# Patient Record
Sex: Male | Born: 1940
Health system: Southern US, Community
[De-identification: ages and names within clinical notes are randomized; demographics above are authoritative.]

## PROBLEM LIST (undated history)

## (undated) DIAGNOSIS — T7840XA Allergy, unspecified, initial encounter: Secondary | ICD-10-CM

## (undated) DIAGNOSIS — I10 Essential (primary) hypertension: Secondary | ICD-10-CM

## (undated) DIAGNOSIS — N189 Chronic kidney disease, unspecified: Secondary | ICD-10-CM

## (undated) DIAGNOSIS — E785 Hyperlipidemia, unspecified: Secondary | ICD-10-CM

## (undated) DIAGNOSIS — L409 Psoriasis, unspecified: Secondary | ICD-10-CM

## (undated) DIAGNOSIS — I2510 Atherosclerotic heart disease of native coronary artery without angina pectoris: Secondary | ICD-10-CM

## (undated) DIAGNOSIS — I252 Old myocardial infarction: Secondary | ICD-10-CM

## (undated) DIAGNOSIS — M1990 Unspecified osteoarthritis, unspecified site: Secondary | ICD-10-CM

## (undated) DIAGNOSIS — C4490 Unspecified malignant neoplasm of skin, unspecified: Secondary | ICD-10-CM

## (undated) HISTORY — DX: Atherosclerotic heart disease of native coronary artery without angina pectoris: I25.10

## (undated) HISTORY — PX: OTHER SURGICAL HISTORY: SHX169

## (undated) HISTORY — DX: Allergy, unspecified, initial encounter: T78.40XA

## (undated) HISTORY — DX: Unspecified malignant neoplasm of skin, unspecified: C44.90

## (undated) HISTORY — DX: Psoriasis, unspecified: L40.9

## (undated) HISTORY — DX: Old myocardial infarction: I25.2

## (undated) HISTORY — DX: Hyperlipidemia, unspecified: E78.5

## (undated) HISTORY — DX: Chronic kidney disease, unspecified: N18.9

## (undated) HISTORY — PX: HEMORROIDECTOMY: SUR656

## (undated) HISTORY — DX: Unspecified osteoarthritis, unspecified site: M19.90

## (undated) HISTORY — DX: Essential (primary) hypertension: I10

---

## 2004-01-10 ENCOUNTER — Inpatient Hospital Stay (HOSPITAL_COMMUNITY): Admission: EM | Admit: 2004-01-10 | Discharge: 2004-01-14 | Payer: Self-pay | Admitting: Emergency Medicine

## 2004-01-13 HISTORY — PX: CARDIAC CATHETERIZATION: SHX172

## 2010-04-10 ENCOUNTER — Ambulatory Visit: Payer: Self-pay | Admitting: Cardiology

## 2010-04-10 ENCOUNTER — Encounter: Payer: Self-pay | Admitting: Family Medicine

## 2010-07-15 ENCOUNTER — Ambulatory Visit: Payer: Self-pay | Admitting: Cardiology

## 2010-07-23 NOTE — Letter (Signed)
Summary: [facility]   Imported By: Maryln Gottron 04/21/2010 09:42:42  _____________________________________________________________________  External Attachment:    Type:   Image     Comment:   External Document

## 2010-09-17 ENCOUNTER — Telehealth: Payer: Self-pay | Admitting: Cardiology

## 2010-09-17 NOTE — Telephone Encounter (Signed)
recvd call from pt wife, requesting crestor 10mg  samples.  Pt will pick up today.

## 2010-10-13 ENCOUNTER — Telehealth: Payer: Self-pay | Admitting: Cardiology

## 2010-10-13 NOTE — Telephone Encounter (Signed)
Wife called requesting samples for husband of Crestor 10 mg. Do have available. Will pick up.

## 2010-10-13 NOTE — Telephone Encounter (Signed)
Husband needs a refill of Crestor samples. Please call back if/when samples are ready for pickup. I have pulled the chart.

## 2010-10-14 ENCOUNTER — Other Ambulatory Visit: Payer: Self-pay | Admitting: Cardiology

## 2010-10-14 DIAGNOSIS — E785 Hyperlipidemia, unspecified: Secondary | ICD-10-CM

## 2010-10-14 NOTE — Telephone Encounter (Signed)
escribe medication per fax request  

## 2010-11-06 NOTE — Cardiovascular Report (Signed)
NAME:  [name] Brennan, [name] Brennan NO.:  [id number]   MEDICAL RECORD NO.:  [id number]                   PATIENT TYPE:  INP   LOCATION:  2006                                 FACILITY:  [facility]   PHYSICIAN:  Elmore Guise., M.D.           DATE OF BIRTH:  1941/02/05   DATE OF PROCEDURE:  01/13/2004  DATE OF DISCHARGE:                              CARDIAC CATHETERIZATION   PREOPERATIVE DIAGNOSES:  1. Inferior myocardial infarction.  2. Hypertension.  3. Dyslipidemia.   POSTOPERATIVE DIAGNOSES:  1. Inferior myocardial infarction.  2. Hypertension.  3. Dyslipidemia.   CARDIOLOGIST PRESENT:  Dr. Lady Deutscher   _____________  Dr. Delfin Edis   PROCEDURE:  1. Cardiac catheterization.  2. Coronary left ventricular and right iliac angiogram.   PROCEDURE IN DETAIL:  The patient brought to the cardiac catheterization  laboratory after obtaining informed consent.  The patient was prepped and  draped in a sterile fashion.  A 6-French sheath was placed in the right  femoral artery.  Approximately 10 mL of lidocaine was used for local  anesthesia.  A 6-French JL4, JR4, and pigtail catheters were used for  coronary and LV angiogram.  The patient tolerated procedure well.  No  apparent complications.   FINDINGS:  1. Left main:  Normal.  2. LAD:  Mild luminal irregularities (large caliber vessel).  3. Left circumflex:  Nondominant, mild luminal irregularities.  4. RCA:  Dominant.  100% mid occlusion.  Distal, mid vessel fills with good     left to right collaterals.  Faint right to right collaterals also noted.  5. LV:  EF was 50-55% with mild diaphragmatic hypokinesis.  6. Right common femoral was normal.  7. Successful AngioSeal was deployed.  The patient was noted to have a very     small hematoma at access site.   IMPRESSION:  1. Single vessel coronary artery disease with good collateral flow.  2. Preserved left ventricular systolic function with ejection  fraction of 50-     55% with mild diaphragmatic hypokinesis.   PLAN:  Will continue maximal medical therapy.                                               Elmore Guise., M.D.    TWK/MEDQ  D:  01/13/2004  T:  01/13/2004  Job:  [id number]

## 2010-11-06 NOTE — H&P (Signed)
NAME:  [name], Brennan NO.:  [id number]   MEDICAL RECORD NO.:  [id number]                   PATIENT TYPE:  EMS   LOCATION:  [facility]                                 FACILITY:  [facility]   PHYSICIAN:  Elmore Guise., M.D.           DATE OF BIRTH:  05/01/1941   DATE OF ADMISSION:  01/10/2004  DATE OF DISCHARGE:                                HISTORY & PHYSICAL   HISTORY OF PRESENT ILLNESS:  [age]-year-old white male with past  medical history of hypertension, dyslipidemia and remote history of tobacco  abuse, who presents with 2-week history of productive cough, shortness of  breath and sharp inspiratory chest pain.  The patient reports a couple of  visits to his primary care physician over the last 2 weeks because he just  doesn't feel right, reports that he has been lethargic and fatigued for the  last 10 days.  He was seen for a cardiac evaluation, reports no further  chest pain except an inspiratory pain and a raw feeling in his chest,  however, notes that his pains improve when he repositions himself or he gets  and walks around.  Today, the patient had blood work done in the office.  CPK was 113, MB was 7.1, troponin I was 3.9; the patient was then advised to  come to the ER for further evaluation.  EKG done at outside facility shows  normal sinus rhythm with inferior Q waves and approximately 1-mm ST  elevation.  EKG done today shows normal sinus rhythm with inferior Q waves  and less than 0.5-mm ST elevation.  Lateral precordial leads show  nonspecific changes.  Patient currently without any significant chest pain.  Does report mild inspiratory pain, however, raw feeling has resolved.   REVIEW OF SYSTEMS:  Review of systems positive for productive cough and  night sweats, shortness of breath and easy fatigability, otherwise,  negative.   CURRENT MEDICATIONS:  1. Trandate 300 mg p.o. b.i.d.  2. Benicar 20 mg daily.  3. Tricor 160 mg daily.  4. Hydrochlorothiazide 25 mg daily.  5. Aspirin 325 mg daily.  6. Azithromycin 500 mg daily.  7. Ibuprofen on a p.r.n. basis.   ALLERGIES:  None.   FAMILY HISTORY:  Family history is positive for father with an MI in 79s and  20s; mother, brothers and sisters are healthy.   SOCIAL HISTORY:  He is married.  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   PHYSICAL EXAMINATION:  VITAL SIGNS:  Today, temperature is 97.7, blood  pressure 128/60, pulse is 78.  GENERAL:  He is an elderly white male, alert and oriented x4, in no acute  distress.  NECK:  Neck is supple, no lymphadenopathy, 2+ carotids, no JVD, no bruits.  LUNGS:  Lungs are clear.  HEART:  Heart is regular.  Normal S1 and S2.  No significant murmurs,  gallops or rubs.  ABDOMEN:  Abdomen is soft, nontender and nondistended.  EXTREMITIES:  Extremities are warm with 2+ pulses and no edema.   SIGNIFICANT LABORATORY DATA:  Labs done in the office show a CPK of 133, MB  of 7.1, troponin was 3.9.  Labs done in the ER show a myoglobin of 137,  decreasing to 133 one hour later, MB of 5.6, going to 6.0, and troponin of  1.7 and then 2.0.   EKG does show sinus rhythm with inferior Q waves and 0.5-mm ST elevations  which are coming down.   Chest x-ray is pending.   IMPRESSION AND PLAN:  1. Recent myocardial infarction, likely occurred 24-48 hours ago.  Patient's     troponin now has stabilized and actually coming down.  His MBs are     decreasing and myoglobins are normal.  EKG is improving.  I have elected     to treat him with heparin, Plavix, beta blocker, aspirin, nitroglycerin     and Integrilin.  We will plan for catheterization Monday, unless he has     further recurrence of his pain or significant increase in his enzymes.  2. For his recent bout of bronchitis, the patient is on azithromycin 500 mg     daily for 3 days.  3. Dyslipidemia.  He will continue his Tricor.  4. We did discuss cardiac catheterization  at length and the patient and wife     are aware and agree with proceeding on Monday, unless clinical condition     changes.                                                Elmore Guise., M.D.    TWK/MEDQ  D:  01/10/2004  T:  01/11/2004  Job:  [id number]

## 2010-11-06 NOTE — Discharge Summary (Signed)
NAME:  [name] Brennan, [name] Brennan NO.:  [id number]   MEDICAL RECORD NO.:  [id number]                   PATIENT TYPE:  INP   LOCATION:  2006                                 FACILITY:  [facility]   PHYSICIAN:  Elmore Guise., M.D.           DATE OF BIRTH:  07/08/1940   DATE OF ADMISSION:  01/10/2004  DATE OF DISCHARGE:  01/14/2004                                 DISCHARGE SUMMARY   DISCHARGE DIAGNOSES:  1. Inferior myocardial infarction.  2. History of hypertension.  3. Dyslipidemia.  4. Bronchitis.   HISTORY OF PRESENT ILLNESS:  A 70 year old white male with past medical  history of hypertension and dyslipidemia presented to the office with chief  complaint of two- to three-week history of progressive inspiratory chest  pain.  EKG done by primary care physician showed probable inferior MI.  Lab  work in the office showed mildly elevated troponin with normal CPK, CPK-MB.  The patient was sent to the ER for admission and treatment.   HOSPITAL COURSE:  The patient was admitted to telemetry and treated with  heparin, Integrilin, aspirin, Lopressor.  Enzymes continued to decrease.  The patient underwent cardiac catheterization on January 13, 2004 showing a  proximal to mid occlusion of the RCA with good collateral left-to-right  filling, very faint collaterals from right-to-right.  Left main, LAD, LCX  showed no significant disease.  LVEF was 50%-55% with mild diaphragmatic  hypokinesis.  Following catheterization, the patient continued to do well  and had no further chest pain.   DISCHARGE MEDICATIONS:  He will be discharged on the following medications:  1. Avapro 150 mg daily.  2. Aspirin 325 mg daily.  3. Hydrochlorothiazide 12.5 mg daily.  4. Lopressor 25 mg b.i.d.  5. Niacin 500 mg p.o. q.h.s.  6. Plavix 75 mg a day.  7. TriCor 145 mg daily.  8. Advil on a p.r.n. basis.   DISCHARGE INSTRUCTIONS:  Instructed the patient to do no strenuous activity  or heavy  lifting for two weeks.   FOLLOW UP:  The patient will follow up at [facility] in 1-2 weeks  or sooner if needed.   PROCEDURES DURING HOSPITALIZATION:  Cardiac catheterization.                                                Elmore Guise., M.D.    TWK/MEDQ  D:  01/14/2004  T:  01/14/2004  Job:  [id number]

## 2010-11-10 ENCOUNTER — Telehealth: Payer: Self-pay | Admitting: Cardiology

## 2010-11-10 NOTE — Telephone Encounter (Signed)
Pt's wife called about wanting samples of  Crestor please call back

## 2010-11-10 NOTE — Telephone Encounter (Signed)
Wife called requesting samples of crestor. May pick up.

## 2010-12-08 ENCOUNTER — Other Ambulatory Visit: Payer: Self-pay | Admitting: Cardiology

## 2010-12-08 NOTE — Telephone Encounter (Signed)
Med refill

## 2011-01-07 ENCOUNTER — Telehealth: Payer: Self-pay | Admitting: Cardiology

## 2011-01-07 NOTE — Telephone Encounter (Signed)
Called requesting samples of Crestor 10 mg. Have available. Will pick up 

## 2011-01-07 NOTE — Telephone Encounter (Signed)
ASKING FOR SAMPLES OF CRESTOR.

## 2011-01-15 ENCOUNTER — Encounter: Payer: Self-pay | Admitting: Cardiology

## 2011-01-18 ENCOUNTER — Encounter (INDEPENDENT_AMBULATORY_CARE_PROVIDER_SITE_OTHER): Payer: Self-pay | Admitting: Surgery

## 2011-02-04 ENCOUNTER — Encounter (INDEPENDENT_AMBULATORY_CARE_PROVIDER_SITE_OTHER): Payer: Self-pay | Admitting: Surgery

## 2011-02-04 ENCOUNTER — Telehealth: Payer: Self-pay | Admitting: Cardiology

## 2011-02-04 ENCOUNTER — Ambulatory Visit (INDEPENDENT_AMBULATORY_CARE_PROVIDER_SITE_OTHER): Payer: Medicare Other | Admitting: Surgery

## 2011-02-04 VITALS — BP 138/92 | HR 60 | Temp 98.2°F | Ht 71.0 in | Wt 160.2 lb

## 2011-02-04 DIAGNOSIS — K4020 Bilateral inguinal hernia, without obstruction or gangrene, not specified as recurrent: Secondary | ICD-10-CM

## 2011-02-04 NOTE — Patient Instructions (Signed)
Given literature for inguinal hernias

## 2011-02-04 NOTE — Telephone Encounter (Signed)
Chart in box 

## 2011-02-04 NOTE — Progress Notes (Addendum)
Chief Complaint  Patient presents with  . Other    new pt- eval of bilateral hernias    [name] Brennan is a 70 y.o. (DOB: [birth year])  white male who is a patient of BURNETT,BRENT A, MD  and comes to me today for left/bilateral inguinal hernias.  The patient comes to me today accompanied by his wife and son. He has noticed a left inguinal hernia for 30 years. 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 He tolerated hernia for years, though it is slightly larger and pops out more often. He wears a truss that he has partially fabricated himself.  Gastrointestinal: No history of peptic ulcer disease, liver disease, pancreatic disease, or colon disease. He had a hemorrhoidectomy by Dr. Judithe Modest in 1978.  He is hard of hearing which makes the interview challenging.  Past Medical History  Diagnosis Date  . Allergy   . Atherosclerotic heart disease   . Arthritis   . Hyperlipidemia   . Hypertension     Benign essential  . Psoriasis   . Hearing loss   . Inguinal hernia July 2012    Bilateral   . Hemorrhoids 1979  . Heart attack     heart blockage  . Skin cancer     Past Surgical History  Procedure Date  . Hemorroidectomy     Current Outpatient Prescriptions  Medication Sig Dispense Refill  . aspirin 81 MG tablet Take 81 mg by mouth daily.        . fenofibrate 160 MG tablet TAKE 1 TABLET BY MOUTH EVERY DAY  30 tablet  5  . fish oil-omega-3 fatty acids 1000 MG capsule Take 1,000-2,000 mg by mouth daily.        Marland Kitchen gabapentin (NEURONTIN) 600 MG tablet Take 600 mg by mouth as needed.        . GuaiFENesin (MUCUS RELIEF ADULT PO) Take by mouth daily.        Marland Kitchen ibuprofen (ADVIL,MOTRIN) 400 MG tablet as needed.      Marland Kitchen lisinopril-hydrochlorothiazide (PRINZIDE,ZESTORETIC) 20-12.5 MG per tablet Daily.      . metoprolol (LOPRESSOR) 50 MG tablet TAKE 1 TABLET EVERY DAY  30 tablet  5  . niacin 500 MG CR capsule Take 500 mg by mouth at bedtime.        . rosuvastatin (CRESTOR) 10 MG tablet  Take 10 mg by mouth daily.        Marland Kitchen triamcinolone (KENALOG) 0.1 % cream Daily.      Marland Kitchen lisinopril (PRINIVIL,ZESTRIL) 10 MG tablet Take 10 mg by mouth daily.         Review of Systems: Skin:  Psoriasis.  No history of abnormal moles. Infection:  No history of hepatitis or HIV.  No history of MRSA. Neurologic:  No history of stroke.  No history of seizure.  No history of headaches. Cardiac:  Hypertension. He had a cardiac cath approx 2007 by Dr. Reyes Ivan.  He was told things were okay.  He is now followed by Dr. Demetrius Charity. [place].   Pulmonary:  Does not smoke cigarettes.  No asthma or bronchitis.  No OSA/CPAP.  Endocrine:  No diabetes. No thyroid disease. Gastrointestinal:  See HPI. Urologic:  No history of kidney stones.  No history of bladder infections. Musculoskeletal:  No history of joint or back disease. Hematologic:  No bleeding disorder.  No history of anemia.  Not anticoagulated.  No Known Allergies  SOCIAL and FAMILY HISTORY: Retired Psychologist, occupational.  Accompanied by wife and  son.  PHYSICAL EXAM: BP 138/92  Pulse 60  Temp 98.2 F (36.8 C)  Ht 5\' 11"  (1.803 m)  Wt 160 lb 4 oz (72.689 kg)  BMI 22.35 kg/m2  HEENT: Normal. Pupils equal. Normal dentition. Neck: Supple. No thyroid mass. Lymph Nodes:  No supraclavicular or cervical nodes. Lungs: Clear and symmetric. Heart:  RRR. No murmur.  Abdomen: Left > right inguinal hernia.  Reducible.  No mass. No tenderness.Normal bowel sounds.  No abdominal scars. Rectal: Not done. Extremities:  Good strength in upper and lower extremities. Neurologic:  Grossly intact to motor and sensory function.  DATA REVIEWED: Notes from Dr. Doristine Counter  ASSESSMENT and PLAN: 1.  Bilateral inguinal hernia.  Left > right.    I discussed the indications and complications of hernia surgery with the patient.  I discussed both the laparoscopic and open approach to hernia repair..  The potential risks of hernia surgery include, but are not limited to, bleeding,  infection, open surgery, nerve injury, and recurrence of the hernia.  I provided the patient literature about hernia surgery.  I think he is a candidate for laparoscopic repair.  2.  CAD.  Sees Dr. P. [place].    Would like card clearance prior to surgery.   [Cleared by Dr. [place].     DN 02/10/11] 3.  Hypertension. 4.  Psoriasis. [5.  Mildly elevated creatinine on pre op labs.  DN 03/05/11]

## 2011-02-04 NOTE — Telephone Encounter (Signed)
Pt's wife was sent from [facility], Dr. Ezzard Standing to schedule a cardiac clearance very soon, pt surgery is for double hernia but not scheduled yet, please call pt's wife to discuss and schedule with Dr. [place] very soon

## 2011-02-04 NOTE — Telephone Encounter (Signed)
Lm

## 2011-02-08 NOTE — Telephone Encounter (Signed)
Notified wife that Mr.Raben is cleared for surgery. Sent fax to Dr. Ezzard Standing that he has been cleared. Also made him an app to see Dr. [place] for Oct,

## 2011-02-09 ENCOUNTER — Telehealth (INDEPENDENT_AMBULATORY_CARE_PROVIDER_SITE_OTHER): Payer: Self-pay

## 2011-02-09 NOTE — Telephone Encounter (Signed)
I notified pt's wife we received the cardiac clearance from Dr [place].  I asked him to stop aspirin and ibuprofen 7 days preop.  The orders will be given to surgery scheduling tomorrow to call pt

## 2011-02-11 ENCOUNTER — Encounter: Payer: Self-pay | Admitting: Cardiology

## 2011-02-25 ENCOUNTER — Telehealth: Payer: Self-pay | Admitting: Cardiology

## 2011-02-25 NOTE — Telephone Encounter (Signed)
[facility] faxed request for last OV Note, most recent EKG, Chest Xray, Stress test and ECHO if at all available.   Faxed last OV Note and EKG today.  No Chest Xray, Stress or ECHO available.

## 2011-03-04 ENCOUNTER — Ambulatory Visit (HOSPITAL_COMMUNITY)
Admission: RE | Admit: 2011-03-04 | Discharge: 2011-03-04 | Disposition: A | Discharge status: Home / Self Care | Payer: Medicare Other | Source: Ambulatory Visit | Attending: Surgery | Admitting: Surgery

## 2011-03-04 ENCOUNTER — Other Ambulatory Visit (INDEPENDENT_AMBULATORY_CARE_PROVIDER_SITE_OTHER): Payer: Self-pay | Admitting: Surgery

## 2011-03-04 ENCOUNTER — Encounter (HOSPITAL_COMMUNITY): Payer: Medicare Other

## 2011-03-04 DIAGNOSIS — K4020 Bilateral inguinal hernia, without obstruction or gangrene, not specified as recurrent: Secondary | ICD-10-CM | POA: Insufficient documentation

## 2011-03-04 DIAGNOSIS — Z01818 Encounter for other preprocedural examination: Secondary | ICD-10-CM | POA: Insufficient documentation

## 2011-03-04 DIAGNOSIS — Z01812 Encounter for preprocedural laboratory examination: Secondary | ICD-10-CM | POA: Insufficient documentation

## 2011-03-04 LAB — BASIC METABOLIC PANEL
BUN: 31 mg/dL — ABNORMAL HIGH (ref 6–23)
Calcium: 10.4 mg/dL (ref 8.4–10.5)
Creatinine, Ser: 1.58 mg/dL — ABNORMAL HIGH (ref 0.50–1.35)
GFR calc Af Amer: 53 mL/min — ABNORMAL LOW (ref >60)
GFR calc non Af Amer: 43 mL/min — ABNORMAL LOW (ref >60)
Glucose, Bld: 94 mg/dL (ref 70–99)
Potassium: 4.4 mEq/L (ref 3.5–5.1)

## 2011-03-04 LAB — CBC
HCT: 43 % (ref 39.0–52.0)
MCH: 28.7 pg (ref 26.0–34.0)
MCHC: 32.8 g/dL (ref 30.0–36.0)
Platelets: 203 10*3/uL (ref 150–400)

## 2011-03-05 NOTE — Progress Notes (Signed)
Quick Note:  These labs are OK for surgery. ______ 

## 2011-03-11 ENCOUNTER — Ambulatory Visit (HOSPITAL_COMMUNITY)
Admission: RE | Admit: 2011-03-11 | Discharge: 2011-03-11 | Disposition: A | Discharge status: Home / Self Care | Payer: Medicare Other | Source: Ambulatory Visit | Attending: Surgery | Admitting: Surgery

## 2011-03-11 DIAGNOSIS — I2510 Atherosclerotic heart disease of native coronary artery without angina pectoris: Secondary | ICD-10-CM | POA: Insufficient documentation

## 2011-03-11 DIAGNOSIS — I252 Old myocardial infarction: Secondary | ICD-10-CM | POA: Insufficient documentation

## 2011-03-11 DIAGNOSIS — Z01812 Encounter for preprocedural laboratory examination: Secondary | ICD-10-CM | POA: Insufficient documentation

## 2011-03-11 DIAGNOSIS — I10 Essential (primary) hypertension: Secondary | ICD-10-CM | POA: Insufficient documentation

## 2011-03-11 DIAGNOSIS — Z85828 Personal history of other malignant neoplasm of skin: Secondary | ICD-10-CM | POA: Insufficient documentation

## 2011-03-11 DIAGNOSIS — K4020 Bilateral inguinal hernia, without obstruction or gangrene, not specified as recurrent: Secondary | ICD-10-CM

## 2011-03-11 DIAGNOSIS — Z01818 Encounter for other preprocedural examination: Secondary | ICD-10-CM | POA: Insufficient documentation

## 2011-03-11 DIAGNOSIS — E785 Hyperlipidemia, unspecified: Secondary | ICD-10-CM | POA: Insufficient documentation

## 2011-03-11 DIAGNOSIS — R748 Abnormal levels of other serum enzymes: Secondary | ICD-10-CM | POA: Insufficient documentation

## 2011-03-11 DIAGNOSIS — L408 Other psoriasis: Secondary | ICD-10-CM | POA: Insufficient documentation

## 2011-03-11 DIAGNOSIS — Z79899 Other long term (current) drug therapy: Secondary | ICD-10-CM | POA: Insufficient documentation

## 2011-03-11 HISTORY — PX: INGUINAL HERNIA REPAIR: SUR1180

## 2011-03-13 ENCOUNTER — Encounter: Payer: Self-pay | Admitting: Cardiology

## 2011-03-16 NOTE — Op Note (Signed)
[name], [name] NO.:  [id number]  MEDICAL RECORD NO.:  [id number]  LOCATION:  [facility]                         FACILITY:  [facility]  PHYSICIAN:  Sandria Bales. Ezzard Standing, M.D.  DATE OF BIRTH:  June 23, 1940  DATE OF PROCEDURE: 20 Sept 2012                              OPERATIVE REPORT   PREOPERATIVE DIAGNOSIS:  Bilateral inguinal hernia, left greater than right.  POSTOPERATIVE DIAGNOSIS:  Bilateral hernia, left greater than right (left inguinal hernia was both indirect and direct, right inguinal hernia was direct inguinal hernia).  PROCEDURE:  Laparoscopic bilateral inguinal hernia repair with pre cut Atrium mesh.  SURGEON:  Sandria Bales. Ezzard Standing, M.D.  FIRST ASSISTANT:  None.  ANESTHESIA:  General endotracheal.  ESTIMATED BLOOD LOSS:  Minimal.  INDICATION FOR PROCEDURE:  [name] Brennan is a 70 year old white male patient of Dr. Marjory Lies, who comes with a longstanding left inguinal hernia and has a smaller right inguinal hernia.  He has been wearing a truss that he made himself, which took care of his symptoms until recently. He now comes for repair of these hernias.  I discussed with him laparoscopic inguinal hernia repair.  I discussed the indications and potential complications.  The potential complications include bleeding, infection, nerve injury, and the possibility of open hernia.  OPERATIVE NOTE:  The patient was placed in a supine position, given a general endotracheal anesthetic.  His lower abdomen was shaved, prepped with ChloraPrep and Betadine and sterilely draped.  A time-out was held and surgical checklist run.  He had a Foley catheter in place, was given 1 g of Ancef at the initiation of the procedure.  I went through an infraumbilical incision with sharp dissection carried down to the rectus abdominis fascia.    I went to the left because this was the larger hernia, cut through the anterior rectus fascia, retracted the rectus muscle anteriorly,  and passed the PBD-2 balloon in the preperitoneal space and insufflated this under direct visualization.  He had a moderate dissection in the preperitoneal space.  The balloon took the left inferior epigastric vessels off the left lower abdominal wall, so I did have to deal with that during the procedure.  I placed two additional trocars, a 5-mm right lower quadrant and a 5-mm left lower quadrant under direct visualization.    I dissected out to both inguinal floors, the cord structures, and laterally in the pre peritoneal space.  On the left side, he had a medium-sized direct inguinal hernia at the lateral inguinal floor.  He also had a small to medium indirect hernia sac, which I took off the cord structures. The sac was probably 7-8 cm long.  I placed an Endoloop across the base of the sac.  On the right side, he had a medium sized direct inguinal hernia.  I found no evidence of an indirect component.  I then carried out a full repair using precut Atrium C-QUR Lite mesh on both sides, I placed this in the preperitoneal space.  I tacked it medially to the pubic tubercle, inferiorly to [employer] ligament.  I put the mesh around the cord structures and tacked it anteriorly to the transversalis  fascia, avoided the area lateral to the cord structures and inferior to the ileopubic tract.  On the left side, because it dissected down the inferior epigastric vessels, I put this mesh anterior to the inferior vessels on the left side.  Both mesh lay flat.  They covered the direct floor of hernias well, they recreated the internal ring well.  I desufflated the abdomen once to make sure the mesh laid in place, which it did.  During the dissection, he did get a fair amount of subcu air.  I then removed the trocars.  I closed the infraumbilical incision with a 0 Vicryl suture at the umbilicus and closed the skin with 5-0 Monocryl sutures.  I painted the wounds with Dermabond, sterilely dressed  them.  The patient tolerated the procedure well, was transported to recovery room in good condition.  Sponge and needle counts were correct at the end of the case.   Sandria Bales. Ezzard Standing, M.D., FACS   DHN/MEDQ  D:  03/11/2011  T:  03/11/2011  Job:  [id number]  cc:   Peter M. [place], M.D. Fax: [phone]  Marjory Lies, M.D. Fax: [phone]  Electronically Signed by Ovidio Kin M.D. on 03/16/2011 12:31:08 PM

## 2011-03-23 ENCOUNTER — Encounter (INDEPENDENT_AMBULATORY_CARE_PROVIDER_SITE_OTHER): Payer: Self-pay

## 2011-03-24 ENCOUNTER — Encounter (INDEPENDENT_AMBULATORY_CARE_PROVIDER_SITE_OTHER): Payer: Self-pay | Admitting: Surgery

## 2011-03-24 ENCOUNTER — Ambulatory Visit (INDEPENDENT_AMBULATORY_CARE_PROVIDER_SITE_OTHER): Payer: Medicare Other | Admitting: Surgery

## 2011-03-24 VITALS — BP 148/98 | HR 56 | Temp 98.6°F | Resp 12 | Ht 71.0 in | Wt 156.6 lb

## 2011-03-24 DIAGNOSIS — Z9889 Other specified postprocedural states: Secondary | ICD-10-CM | POA: Insufficient documentation

## 2011-03-24 DIAGNOSIS — Z8719 Personal history of other diseases of the digestive system: Secondary | ICD-10-CM

## 2011-03-24 NOTE — Progress Notes (Signed)
ASSESSMENT AND PLAN: 1.  Bilateral inguinal hernia repair.  Laparoscopic.  03/11/2011 at [facility].  Has done well.  Is to limit lifting for one month from surgery.  Return PRN.  2. CAD. Sees Dr. P. [place].  Has appointment to see him next week. 3. Hypertension.  4. Psoriasis.  5. Mildly elevated creatinine on pre op labs.   HISTORY OF PRESENT ILLNESS: Chief Complaint  Patient presents with  . Follow-up    PO BIH 03/11/11    [name] Brennan is a 70 y.o. (DOB: February 05, 1941)  white male who is a patient of BURNETT,BRENT A, MD and comes to me today for bilateral inguinal hernia.  He is accompanied with his wife.  He has some swelling and discoloration early after surgery.  This has resolved.  He is anxious to get more active.  I reminded him that I want him to take it easy for one month from the date of surgery.   PHYSICAL EXAM: BP 148/98  Pulse 56  Temp(Src) 98.6 F (37 C) (Temporal)  Resp 12  Ht 5\' 11"  (1.803 m)  Wt 156 lb 9.6 oz (71.033 kg)  BMI 21.84 kg/m2  Abdomen/groin:  Incisions are well healed.  He still has some dermabond on the skin.  He has a small knot in the left groin, which should go away with time.

## 2011-03-25 ENCOUNTER — Telehealth: Payer: Self-pay | Admitting: Cardiology

## 2011-03-25 NOTE — Telephone Encounter (Signed)
Wife called stating BP yest at doctor's office was 148/98. Was concerned. BP normally is 134-135/91/84. Has been eating a lot of peanuts. Per Dr. [place] advised to monitor BP and if continues to be elevated to call us back. He has an app 10/15

## 2011-03-25 NOTE — Telephone Encounter (Signed)
Pt called and said BP 148/97 please call

## 2011-04-02 ENCOUNTER — Ambulatory Visit: Payer: Self-pay | Admitting: Cardiology

## 2011-04-05 ENCOUNTER — Ambulatory Visit (INDEPENDENT_AMBULATORY_CARE_PROVIDER_SITE_OTHER): Payer: Medicare Other | Admitting: Cardiology

## 2011-04-05 ENCOUNTER — Encounter: Payer: Self-pay | Admitting: Cardiology

## 2011-04-05 VITALS — BP 152/103 | HR 86 | Ht 71.0 in | Wt 161.0 lb

## 2011-04-05 DIAGNOSIS — I259 Chronic ischemic heart disease, unspecified: Secondary | ICD-10-CM

## 2011-04-05 DIAGNOSIS — I10 Essential (primary) hypertension: Secondary | ICD-10-CM | POA: Insufficient documentation

## 2011-04-05 DIAGNOSIS — I2510 Atherosclerotic heart disease of native coronary artery without angina pectoris: Secondary | ICD-10-CM

## 2011-04-05 DIAGNOSIS — E785 Hyperlipidemia, unspecified: Secondary | ICD-10-CM

## 2011-04-05 MED ORDER — AMLODIPINE BESYLATE 5 MG PO TABS: 5.0000 mg | ORAL_TABLET | Freq: Every day | ORAL | Status: DC

## 2011-04-05 NOTE — Assessment & Plan Note (Signed)
Blood pressure is poorly controlled. Have recommended the addition of amlodipine 5 mg daily to his current regimen. We will have him return in 3 weeks for reassessment of his control.

## 2011-04-05 NOTE — Progress Notes (Signed)
[name] Brennan Date of Birth: Oct 02, 1940 Medical Record #[id number]  History of Present Illness: [name] Brennan is seen for one-year followup. He has a history of coronary disease with remote inferior myocardial infarction. He has known occlusion of the mid right coronary. He states he has been doing very well from a cardiac standpoint the past year. He denies any symptoms of chest pain or shortness of breath. He has had a difficult time with his blood pressure control. He is currently taking metoprolol and lisinopril. He was tried on lisinopril HCTZ and carvedilol but did not tolerate these. He also has a history of renal insufficiency with HCTZ. He does complain of dizziness when his blood pressure is elevated.  Current Outpatient Prescriptions on File Prior to Visit  Medication Sig Dispense Refill  . aspirin 81 MG tablet Take 81 mg by mouth daily.        . fenofibrate 160 MG tablet TAKE 1 TABLET BY MOUTH EVERY DAY  30 tablet  5  . fish oil-omega-3 fatty acids 1000 MG capsule Take 1,000-2,000 mg by mouth daily.        Marland Kitchen gabapentin (NEURONTIN) 600 MG tablet Take 600 mg by mouth as needed.        . GuaiFENesin (MUCUS RELIEF ADULT PO) Take by mouth daily.        Marland Kitchen ibuprofen (ADVIL,MOTRIN) 400 MG tablet as needed.      Marland Kitchen lisinopril (PRINIVIL,ZESTRIL) 10 MG tablet Take 10 mg by mouth daily.        . metoprolol (LOPRESSOR) 50 MG tablet TAKE 1 TABLET EVERY DAY  30 tablet  5  . niacin 500 MG CR capsule Take 500 mg by mouth at bedtime.        . rosuvastatin (CRESTOR) 10 MG tablet Take 10 mg by mouth daily.        Marland Kitchen triamcinolone (KENALOG) 0.1 % cream Daily.        Allergies  Allergen Reactions  . Avalide (Irbesartan-Hydrochlorothiazide)     Apparently Caused an increase in Creatinine  . Cymbalta (Duloxetine Hcl)     Anaphylactic    . Lexapro     Hives  . Tricor     Intolerant     Past Medical History  Diagnosis Date  . Allergy   . Atherosclerotic heart disease   . Hypertension     Benign  essential  . Psoriasis   . Hearing loss   . Inguinal hernia September 2012    Bilateral   . Hemorrhoids 1979  . Skin cancer   . Coronary disease     with remote inferior  myocardial Infarction. Documented occulusion of the right  coronary  . Dyslipidemia     Combined with Hx of marked hypertriglyceridemia  . Osteoarthritis   . Myocardial infarction, old     Past Surgical History  Procedure Date  . Hemorroidectomy   . Cardiac catheterization     Hx of Obstructive single-vessel CAD by Cardiac Cath on January 13, 2004   . Inguinal hernia repair 03/11/2011    bilateral    History  Smoking status  . Never Smoker   Smokeless tobacco  . Not on file    History  Alcohol Use No    Family History  Problem Relation Age of Onset  . Cancer Brother   . Cancer Brother     prostate and skin    Review of Systems: The review of systems is positive for  Bilateral inguinal hernia repair this  summer.  He is still recovering from this. He has mild soreness. All other systems were reviewed and are negative.  Physical Exam: BP 152/103  Pulse 86  Ht 5\' 11"  (1.803 m)  Wt 161 lb (73.029 kg)  BMI 22.45 kg/m2 The patient is alert and oriented x 3.  The mood and affect are normal.  The skin is warm and dry.  Color is normal.  The HEENT exam reveals that the sclera are nonicteric.  The mucous membranes are moist.  The carotids are 2+ without bruits.  There is no thyromegaly.  There is no JVD.  The lungs are clear.  The chest wall is non tender.  The heart exam reveals a regular rate with a normal S1 and S2.  There are no murmurs, gallops, or rubs.  The PMI is not displaced.   Abdominal exam reveals good bowel sounds.  There is no guarding or rebound.  There is no hepatosplenomegaly or tenderness.  There are no masses.  Exam of the legs reveal no clubbing, cyanosis, or edema.  The legs are without rashes.  The distal pulses are intact.  Cranial nerves II - XII are intact.  Motor and sensory functions  are intact.  The gait is normal.  LABORATORY DATA: ECG today demonstrates normal sinus rhythm with occasional PVC. There is evidence of an old inferior infarction.  Assessment / Plan:

## 2011-04-05 NOTE — Assessment & Plan Note (Signed)
He has known occlusion of the right coronary. He is asymptomatic. We will continue with his risk factor modification. I requested a copy of his most recent lab work from Dr. Doristine Counter.

## 2011-04-05 NOTE — Patient Instructions (Addendum)
Continue your current medications.  We will add amlodipine (Norvasc) 5 mg to your current therapy for your blood pressure.  Keep a diary of your blood pressure readings and bring to your next visit.  You will see Lawson Fiscal, my nurse practitioner, in 3 weeks.  11/5 at 9:00

## 2011-04-09 ENCOUNTER — Telehealth: Payer: Self-pay | Admitting: Cardiology

## 2011-04-09 NOTE — Telephone Encounter (Signed)
Pt's wife requesting samples of crestor

## 2011-04-09 NOTE — Telephone Encounter (Signed)
Crestor 10 mg/ 28 sample pills placed at the front desk. Patient aware.

## 2011-04-26 ENCOUNTER — Encounter: Payer: Self-pay | Admitting: Nurse Practitioner

## 2011-04-26 ENCOUNTER — Telehealth: Payer: Self-pay | Admitting: Cardiology

## 2011-04-26 ENCOUNTER — Ambulatory Visit (INDEPENDENT_AMBULATORY_CARE_PROVIDER_SITE_OTHER): Payer: Medicare Other | Admitting: Nurse Practitioner

## 2011-04-26 VITALS — BP 160/100 | HR 70 | Ht 71.0 in | Wt 161.8 lb

## 2011-04-26 DIAGNOSIS — I259 Chronic ischemic heart disease, unspecified: Secondary | ICD-10-CM

## 2011-04-26 DIAGNOSIS — I2510 Atherosclerotic heart disease of native coronary artery without angina pectoris: Secondary | ICD-10-CM

## 2011-04-26 DIAGNOSIS — I10 Essential (primary) hypertension: Secondary | ICD-10-CM

## 2011-04-26 MED ORDER — AMLODIPINE BESYLATE 5 MG PO TABS: 5.0000 mg | ORAL_TABLET | Freq: Two times a day (BID) | ORAL | Status: DC

## 2011-04-26 NOTE — Assessment & Plan Note (Signed)
Blood pressure is not at goal. He is willing to increase the Norvasc to BID. Will watch for swelling. Minimize NSAID use. I will see him back in 3 weeks. He is to bring his cuff in for [place] to check for accuracy. Patient is agreeable to this plan and will call if any problems develop in the interim.

## 2011-04-26 NOTE — Patient Instructions (Addendum)
Goal blood pressure is less than 135/85  Keep checking your blood pressure at home for me and write the readings down.   Increase the Amlodipine to 5 mg two times a day. Watch for swelling.  I need to see him back in 3 weeks. Bring your cuff for me to check.

## 2011-04-26 NOTE — Progress Notes (Signed)
[name] Brennan Date of Birth: Sep 24, 1940 Medical Record #[id number]  History of Present Illness: [name] Brennan is seen today for a 3 week check. He is seen for Dr. [place]. He has had Norvasc added to his regimen for better blood pressure control. He feels ok. No chest pain. He brings in his list of readings. Most of them remain elevated. There is concern about the accuracy of his cuff.   Current Outpatient Prescriptions on File Prior to Visit  Medication Sig Dispense Refill  . aspirin 81 MG tablet Take 81 mg by mouth daily.        . fenofibrate 160 MG tablet TAKE 1 TABLET BY MOUTH EVERY DAY  30 tablet  5  . fish oil-omega-3 fatty acids 1000 MG capsule Take 1,000-2,000 mg by mouth daily.        Marland Kitchen gabapentin (NEURONTIN) 600 MG tablet Take 600 mg by mouth as needed.        . GuaiFENesin (MUCUS RELIEF ADULT PO) Take by mouth daily.        Marland Kitchen ibuprofen (ADVIL,MOTRIN) 400 MG tablet as needed.      Marland Kitchen lisinopril (PRINIVIL,ZESTRIL) 10 MG tablet Take 10 mg by mouth daily.        . metoprolol (LOPRESSOR) 50 MG tablet TAKE 1 TABLET EVERY DAY  30 tablet  5  . niacin 500 MG CR capsule Take 500 mg by mouth at bedtime.        . rosuvastatin (CRESTOR) 10 MG tablet Take 10 mg by mouth daily.        Marland Kitchen triamcinolone (KENALOG) 0.1 % cream Daily.        Allergies  Allergen Reactions  . Avalide (Irbesartan-Hydrochlorothiazide)     Apparently Caused an increase in Creatinine  . Cymbalta (Duloxetine Hcl)     Anaphylactic    . Lexapro     Hives  . Tricor     Intolerant     Past Medical History  Diagnosis Date  . Allergy   . Hypertension     Benign essential  . Psoriasis   . Hearing loss   . Inguinal hernia September 2012    Bilateral   . Hemorrhoids 1979  . Skin cancer   . Coronary disease     with remote inferior  myocardial Infarction. Documented occulusion of the right  coronary  . Dyslipidemia     Combined with Hx of marked hypertriglyceridemia  . Osteoarthritis   . Myocardial infarction, old     Remote inferior MI with known occlusion of the RCA.     Past Surgical History  Procedure Date  . Hemorroidectomy   . Cardiac catheterization     Hx of Obstructive single-vessel CAD by Cardiac Cath on January 13, 2004   . Inguinal hernia repair 03/11/2011    bilateral    History  Smoking status  . Never Smoker   Smokeless tobacco  . Not on file    History  Alcohol Use No    Family History  Problem Relation Age of Onset  . Cancer Brother   . Cancer Brother     prostate and skin    Review of Systems: The review of systems is per the HPI. He is hard of hearing.  All other systems were reviewed and are negative.  Physical Exam: BP 160/100  Pulse 70  Ht 5\' 11"  (1.803 m)  Wt 161 lb 12.8 oz (73.392 kg)  BMI 22.57 kg/m2 Patient is very pleasant and in no acute  distress. Skin is warm and dry. Color is normal.  HEENT is unremarkable. Normocephalic/atraumatic. PERRL. Sclera are nonicteric. Neck is supple. No masses. No JVD. Lungs are clear. Cardiac exam shows a regular rate and rhythm. Abdomen is soft. Extremities are without edema. Gait and ROM are intact. No gross neurologic deficits noted.   LABORATORY DATA:   Assessment / Plan:

## 2011-04-26 NOTE — Telephone Encounter (Signed)
Pharm calling about amilodipine 5mg  Rx written wrong only for 15day supply and they need clarification

## 2011-04-26 NOTE — Assessment & Plan Note (Signed)
Remains asymptomatic.

## 2011-05-18 ENCOUNTER — Encounter: Payer: Self-pay | Admitting: Nurse Practitioner

## 2011-05-18 ENCOUNTER — Ambulatory Visit (INDEPENDENT_AMBULATORY_CARE_PROVIDER_SITE_OTHER): Payer: Medicare Other | Admitting: Nurse Practitioner

## 2011-05-18 VITALS — BP 120/80 | HR 62 | Ht 71.0 in | Wt 161.0 lb

## 2011-05-18 DIAGNOSIS — I2510 Atherosclerotic heart disease of native coronary artery without angina pectoris: Secondary | ICD-10-CM

## 2011-05-18 DIAGNOSIS — I10 Essential (primary) hypertension: Secondary | ICD-10-CM

## 2011-05-18 LAB — BASIC METABOLIC PANEL
BUN: 35 mg/dL — ABNORMAL HIGH (ref 6–23)
CO2: 21 mEq/L (ref 19–32)
Calcium: 9.6 mg/dL (ref 8.4–10.5)
Chloride: 107 mEq/L (ref 96–112)
Creatinine, Ser: 1.5 mg/dL (ref 0.4–1.5)
GFR: 48.58 mL/min — ABNORMAL LOW (ref >60.00)
Glucose, Bld: 80 mg/dL (ref 70–99)
Potassium: 4.4 mEq/L (ref 3.5–5.1)
Sodium: 139 mEq/L (ref 135–145)

## 2011-05-18 MED ORDER — LISINOPRIL 10 MG PO TABS: 10.0000 mg | ORAL_TABLET | Freq: Two times a day (BID) | ORAL | Status: DC

## 2011-05-18 NOTE — Progress Notes (Signed)
[name] Brennan Date of Birth: 11-07-1940 Medical Record #[id number]  History of Present Illness: [name] Brennan is seen back today for a follow up visit. He is seen for Dr. [place]. We increased his Norvasc to BID at his last visit for better blood pressure control. He is feeling "great". Has no complaint whatsoever. Trying to use less salt. Not dizzy or lightheaded. Tolerating his medicines well. He brings in his blood pressure readings from home. Early morning readings remain elevated. No chest pain or shortness of breath.   Current Outpatient Prescriptions on File Prior to Visit  Medication Sig Dispense Refill  . amLODipine (NORVASC) 5 MG tablet Take 1 tablet (5 mg total) by mouth 2 (two) times daily.  60 tablet  11  . aspirin 81 MG tablet Take 81 mg by mouth daily.        . fenofibrate 160 MG tablet TAKE 1 TABLET BY MOUTH EVERY DAY  30 tablet  5  . fish oil-omega-3 fatty acids 1000 MG capsule Take 1,000-2,000 mg by mouth daily.        Marland Kitchen gabapentin (NEURONTIN) 600 MG tablet Take 600 mg by mouth as needed.        . GuaiFENesin (MUCUS RELIEF ADULT PO) Take by mouth as needed.       Marland Kitchen ibuprofen (ADVIL,MOTRIN) 400 MG tablet as needed.      . metoprolol (LOPRESSOR) 50 MG tablet TAKE 1 TABLET EVERY DAY  30 tablet  5  . niacin 500 MG CR capsule Take 500 mg by mouth at bedtime.        . rosuvastatin (CRESTOR) 10 MG tablet Take 10 mg by mouth daily.        Marland Kitchen triamcinolone (KENALOG) 0.1 % cream Daily.      Marland Kitchen DISCONTD: lisinopril (PRINIVIL,ZESTRIL) 10 MG tablet Take 10 mg by mouth daily.          Allergies  Allergen Reactions  . Avalide (Irbesartan-Hydrochlorothiazide)     Apparently Caused an increase in Creatinine  . Cymbalta (Duloxetine Hcl)     Anaphylactic    . Lexapro     Hives  . Tricor     Intolerant     Past Medical History  Diagnosis Date  . Allergy   . Hypertension   . Psoriasis   . Hearing loss   . Inguinal hernia September 2012    Bilateral   . Hemorrhoids 1979  . Skin cancer     . Coronary disease     with remote inferior myocardial infarction. Documented occlusion of the right  coronary  . Dyslipidemia     Combined with hx of marked hypertriglyceridemia  . Osteoarthritis   . Myocardial infarction, old     Remote inferior MI with known occlusion of the RCA.   Marland Kitchen CRI (chronic renal insufficiency)     Past Surgical History  Procedure Date  . Hemorroidectomy   . Cardiac catheterization     Hx of obstructive single-vessel CAD by Cardiac Cath on January 13, 2004   . Inguinal hernia repair 03/11/2011    bilateral    History  Smoking status  . Never Smoker   Smokeless tobacco  . Not on file    History  Alcohol Use No    Family History  Problem Relation Age of Onset  . Cancer Brother   . Cancer Brother     prostate and skin    Review of Systems: The review of systems is per the HPI.  All other  systems were reviewed and are negative.  Physical Exam: BP 120/80  Pulse 62  Ht 5\' 11"  (1.803 m)  Wt 161 lb (73.029 kg)  BMI 22.45 kg/m2 Blood pressure by me is 160/90. His machine reads 159/94. Patient is very pleasant and in no acute distress. Skin is warm and dry. Color is normal.  HEENT is unremarkable. Normocephalic/atraumatic. PERRL. Sclera are nonicteric. Neck is supple. No masses. No JVD. Lungs are clear. Cardiac exam shows a regular rate and rhythm. Abdomen is soft. Extremities are without edema. Gait and ROM are intact. No gross neurologic deficits noted.  LABORATORY DATA: BMET is pending   Assessment / Plan:

## 2011-05-18 NOTE — Assessment & Plan Note (Signed)
Blood pressure has come down some but still not at goal. Morning readings remain elevated. His cuff that he uses is acceptable. We will check a BMET today. Lisinopril is increased to 10 mg BID. I will see him in a month and recheck a BMET on return. He is to continue to monitor at home. Patient is agreeable to this plan and will call if any problems develop in the interim.

## 2011-05-18 NOTE — Patient Instructions (Signed)
Let's increase the Lisinopril to two times a day.  Stay on your other medicines.  We will check your potassium and kidney function today.  I will see you in a month with repeat labs.  Call the [facility] office at [phone] if you have any questions, problems or concerns.

## 2011-05-18 NOTE — Assessment & Plan Note (Signed)
Remains asymptomatic.

## 2011-05-20 ENCOUNTER — Telehealth: Payer: Self-pay | Admitting: Cardiology

## 2011-05-20 DIAGNOSIS — I10 Essential (primary) hypertension: Secondary | ICD-10-CM

## 2011-05-20 NOTE — Telephone Encounter (Signed)
Pt spouse rtn call get lab results

## 2011-05-20 NOTE — Telephone Encounter (Signed)
Notified wife of lab results. Will recheck BMET on return visit 12/31

## 2011-05-24 ENCOUNTER — Telehealth: Payer: Self-pay | Admitting: *Deleted

## 2011-05-24 DIAGNOSIS — I10 Essential (primary) hypertension: Secondary | ICD-10-CM

## 2011-05-24 NOTE — Telephone Encounter (Signed)
Notified son of lab results. Will repeat when comes to see [facility] 12/7. Will send copy to Dr. Doristine Counter.

## 2011-05-24 NOTE — Telephone Encounter (Signed)
Message copied by Lorayne Bender on Mon May 24, 2011  6:13 PM ------      Message from: Dossie Arbour      Created: Fri May 21, 2011  8:41 AM                   ----- Message -----         From: Rosalio Macadamia, NP         Sent: 05/19/2011   7:50 AM           To: [facility] Triage            Ok to report. Labs are satisfactory. Will recheck a BMET on his return visit. Patient of Dr. Elvis Coil.

## 2011-05-28 ENCOUNTER — Other Ambulatory Visit: Payer: Medicare Other | Admitting: *Deleted

## 2011-06-11 ENCOUNTER — Other Ambulatory Visit: Payer: Self-pay | Admitting: *Deleted

## 2011-06-11 MED ORDER — METOPROLOL TARTRATE 50 MG PO TABS: 50.0000 mg | ORAL_TABLET | Freq: Every day | ORAL | Status: DC

## 2011-06-21 ENCOUNTER — Ambulatory Visit (INDEPENDENT_AMBULATORY_CARE_PROVIDER_SITE_OTHER): Payer: Medicare Other | Admitting: Nurse Practitioner

## 2011-06-21 ENCOUNTER — Encounter: Payer: Self-pay | Admitting: Nurse Practitioner

## 2011-06-21 ENCOUNTER — Other Ambulatory Visit: Payer: Medicare Other | Admitting: *Deleted

## 2011-06-21 VITALS — BP 130/92 | HR 60 | Ht 71.0 in | Wt 151.0 lb

## 2011-06-21 DIAGNOSIS — I259 Chronic ischemic heart disease, unspecified: Secondary | ICD-10-CM

## 2011-06-21 DIAGNOSIS — I2510 Atherosclerotic heart disease of native coronary artery without angina pectoris: Secondary | ICD-10-CM

## 2011-06-21 DIAGNOSIS — I10 Essential (primary) hypertension: Secondary | ICD-10-CM

## 2011-06-21 LAB — BASIC METABOLIC PANEL
BUN: 28 mg/dL — ABNORMAL HIGH (ref 6–23)
CO2: 21 mEq/L (ref 19–32)
Calcium: 9.9 mg/dL (ref 8.4–10.5)
Chloride: 102 mEq/L (ref 96–112)
Creat: 1.59 mg/dL — ABNORMAL HIGH (ref 0.50–1.35)
Glucose, Bld: 91 mg/dL (ref 70–99)
Potassium: 4.3 mEq/L (ref 3.5–5.3)
Sodium: 131 mEq/L — ABNORMAL LOW (ref 135–145)

## 2011-06-21 NOTE — Assessment & Plan Note (Signed)
Has improved control. No change in his current regimen. We will see him back in 4 months. He will continue to monitor at home. Patient is agreeable to this plan and will call if any problems develop in the interim.

## 2011-06-21 NOTE — Progress Notes (Signed)
[name] Brennan Date of Birth: [birth year] Medical Record #[id number]  History of Present Illness: [name] Brennan is seen back today for a one month check. He is seen for Dr. [place]. We have increased his ACE for better blood pressure control. He brings in his readings from home which show fair control for the most part. He will have some readings that are elevated but are not consistently elevated. He feels good. Is tolerating his medicines well. He has no complaint.   Current Outpatient Prescriptions on File Prior to Visit  Medication Sig Dispense Refill  . amLODipine (NORVASC) 5 MG tablet Take 1 tablet (5 mg total) by mouth 2 (two) times daily.  60 tablet  11  . aspirin 81 MG tablet Take 81 mg by mouth daily.        . fenofibrate 160 MG tablet TAKE 1 TABLET BY MOUTH EVERY DAY  30 tablet  5  . fish oil-omega-3 fatty acids 1000 MG capsule Take 1,000-2,000 mg by mouth daily.        Marland Kitchen gabapentin (NEURONTIN) 600 MG tablet Take 600 mg by mouth as needed.        . GuaiFENesin (MUCUS RELIEF ADULT PO) Take by mouth as needed.       Marland Kitchen ibuprofen (ADVIL,MOTRIN) 400 MG tablet as needed.      Marland Kitchen lisinopril (PRINIVIL,ZESTRIL) 10 MG tablet Take 1 tablet (10 mg total) by mouth 2 (two) times daily.  60 tablet  11  . metoprolol (LOPRESSOR) 50 MG tablet Take 1 tablet (50 mg total) by mouth daily.  30 tablet  5  . niacin 500 MG CR capsule Take 500 mg by mouth at bedtime.        . rosuvastatin (CRESTOR) 10 MG tablet Take 10 mg by mouth daily.        Marland Kitchen triamcinolone (KENALOG) 0.1 % cream Daily.        Allergies  Allergen Reactions  . Avalide (Irbesartan-Hydrochlorothiazide)     Apparently Caused an increase in Creatinine  . Cymbalta (Duloxetine Hcl)     Anaphylactic    . Lexapro     Hives  . Tricor     Intolerant     Past Medical History  Diagnosis Date  . Allergy   . Hypertension   . Psoriasis   . Hearing loss   . Inguinal hernia September 2012    Bilateral   . Hemorrhoids 1979  . Skin cancer   .  Coronary disease     with remote inferior myocardial infarction. Documented occlusion of the right  coronary  . Dyslipidemia     Combined with hx of marked hypertriglyceridemia  . Osteoarthritis   . Myocardial infarction, old     Remote inferior MI with known occlusion of the RCA.   Marland Kitchen CRI (chronic renal insufficiency)     Past Surgical History  Procedure Date  . Hemorroidectomy   . Cardiac catheterization     Hx of obstructive single-vessel CAD by Cardiac Cath on January 13, 2004   . Inguinal hernia repair 03/11/2011    bilateral    History  Smoking status  . Never Smoker   Smokeless tobacco  . Not on file    History  Alcohol Use No    Family History  Problem Relation Age of Onset  . Cancer Brother   . Cancer Brother     prostate and skin    Review of Systems: The review of systems is positive for decreased hearing.  All  other systems were reviewed and are negative.  Physical Exam: BP 122/90  Pulse 60  Ht 5\' 11"  (1.803 m)  Wt 151 lb (68.493 kg)  BMI 21.06 kg/m2 Patient is very pleasant and in no acute distress. Skin is warm and dry. Color is normal.  HEENT is unremarkable. Normocephalic/atraumatic. PERRL. Sclera are nonicteric. Neck is supple. No masses. No JVD. Lungs are clear. Cardiac exam shows a regular rate and rhythm. Abdomen is soft. Extremities are without edema. Gait and ROM are intact. No gross neurologic deficits noted.   LABORATORY DATA:   Assessment / Plan:

## 2011-06-21 NOTE — Assessment & Plan Note (Signed)
He remains asymptomatic.

## 2011-06-21 NOTE — Patient Instructions (Signed)
We will recheck your labs today.  Continue with your current medicines. Monitor your blood pressure at home.  Let us know if you consistently stay above 140.  Record your readings and bring to your next visit. Limit sodium intake.   Call the [facility] office at [phone] if you have any questions, problems or concerns.

## 2011-06-23 ENCOUNTER — Telehealth: Payer: Self-pay | Admitting: Cardiology

## 2011-06-23 NOTE — Telephone Encounter (Signed)
Patient's wife called.States she forgot what we discussed earlier this am about pt's lab work.Wife was told patient's labs satisfactory except sodium a little low.Bmet scheduled in 2 weeks.

## 2011-06-23 NOTE — Telephone Encounter (Signed)
Returning patient's call,no answer.LMTC

## 2011-06-23 NOTE — Telephone Encounter (Signed)
rtning call to someone she doesn't know who called but she has questions about the lab results she was given

## 2011-07-07 ENCOUNTER — Other Ambulatory Visit (INDEPENDENT_AMBULATORY_CARE_PROVIDER_SITE_OTHER): Payer: Medicare Other | Admitting: *Deleted

## 2011-07-07 DIAGNOSIS — I10 Essential (primary) hypertension: Secondary | ICD-10-CM

## 2011-07-07 LAB — BASIC METABOLIC PANEL
BUN: 30 mg/dL — ABNORMAL HIGH (ref 6–23)
CO2: 23 mEq/L (ref 19–32)
Calcium: 9.2 mg/dL (ref 8.4–10.5)
Chloride: 108 mEq/L (ref 96–112)
Creatinine, Ser: 1.5 mg/dL (ref 0.4–1.5)
GFR: 50.09 mL/min — ABNORMAL LOW (ref >60.00)
Glucose, Bld: 151 mg/dL — ABNORMAL HIGH (ref 70–99)
Potassium: 3.6 mEq/L (ref 3.5–5.1)
Sodium: 139 mEq/L (ref 135–145)

## 2011-07-19 ENCOUNTER — Encounter: Payer: Self-pay | Admitting: Cardiology

## 2011-09-10 ENCOUNTER — Other Ambulatory Visit: Payer: Self-pay | Admitting: *Deleted

## 2011-09-10 DIAGNOSIS — I10 Essential (primary) hypertension: Secondary | ICD-10-CM

## 2011-09-10 MED ORDER — LISINOPRIL 10 MG PO TABS: 10.0000 mg | ORAL_TABLET | Freq: Two times a day (BID) | ORAL | Status: DC

## 2011-09-10 MED ORDER — AMLODIPINE BESYLATE 5 MG PO TABS: 5.0000 mg | ORAL_TABLET | Freq: Two times a day (BID) | ORAL | Status: DC

## 2011-09-10 MED ORDER — METOPROLOL TARTRATE 50 MG PO TABS: 50.0000 mg | ORAL_TABLET | Freq: Every day | ORAL | Status: DC

## 2011-10-20 ENCOUNTER — Ambulatory Visit: Payer: Medicare Other | Admitting: Cardiology

## 2011-11-23 ENCOUNTER — Encounter: Payer: Self-pay | Admitting: Cardiology

## 2011-11-23 ENCOUNTER — Ambulatory Visit (INDEPENDENT_AMBULATORY_CARE_PROVIDER_SITE_OTHER): Payer: Medicare Other | Admitting: Cardiology

## 2011-11-23 VITALS — BP 130/84 | HR 60 | Ht 71.0 in | Wt 159.0 lb

## 2011-11-23 DIAGNOSIS — I259 Chronic ischemic heart disease, unspecified: Secondary | ICD-10-CM

## 2011-11-23 DIAGNOSIS — I2510 Atherosclerotic heart disease of native coronary artery without angina pectoris: Secondary | ICD-10-CM

## 2011-11-23 DIAGNOSIS — I10 Essential (primary) hypertension: Secondary | ICD-10-CM

## 2011-11-23 NOTE — Patient Instructions (Signed)
Continue your current medication.  I will see you again in 6 months.   

## 2011-11-24 NOTE — Assessment & Plan Note (Signed)
He is still expressing some fluctuation in his blood pressure readings but in general his blood pressure has been under good control on his current regimen. I've made no changes today. I recommended that he limit his use of anti-inflammatory medication.

## 2011-11-24 NOTE — Assessment & Plan Note (Signed)
He has known occlusion of the right coronary in 2005. He is asymptomatic. We discussed followup stress testing since it is been a year since his last evaluation but he adamantly refuses a stress test. At this point we'll continue to monitor for any new symptoms. We will continue risk factor modification.

## 2011-11-24 NOTE — Progress Notes (Signed)
[name] Brennan Date of Birth: 1941-01-12 Medical Record #[id number]  History of Present Illness: [name] Brennan is seen for followup today for coronary disease. He had a cardiac catheterization in 2005 which showed an occluded right coronary with collateral flow. He currently denies any symptoms of shortness of breath, chest pain, edema, or palpitations. He overall feels well. He has had adjustment in his blood pressure medicines recently and he brings extensive records of his blood pressure today. His blood pressure tends to run a little bit high in the morning when he first gets up before he takes his medication but otherwise has been in good control. Since his last visit with me he has had double hernia surgery as well as cataract and lens implants.  Current Outpatient Prescriptions on File Prior to Visit  Medication Sig Dispense Refill  . amLODipine (NORVASC) 5 MG tablet Take 1 tablet (5 mg total) by mouth 2 (two) times daily.  180 tablet  2  . aspirin 81 MG tablet Take 81 mg by mouth daily.        . fenofibrate 160 MG tablet TAKE 1 TABLET BY MOUTH EVERY DAY  30 tablet  5  . fish oil-omega-3 fatty acids 1000 MG capsule Take 1,000-2,000 mg by mouth daily.        Marland Kitchen gabapentin (NEURONTIN) 600 MG tablet Take 600 mg by mouth as needed.        . GuaiFENesin (MUCUS RELIEF ADULT PO) Take by mouth as needed.       Marland Kitchen lisinopril (PRINIVIL,ZESTRIL) 10 MG tablet Take 1 tablet (10 mg total) by mouth 2 (two) times daily.  180 tablet  2  . metoprolol (LOPRESSOR) 50 MG tablet Take 1 tablet (50 mg total) by mouth daily.  90 tablet  2  . niacin 500 MG CR capsule Take 500 mg by mouth at bedtime.        . rosuvastatin (CRESTOR) 10 MG tablet Take 10 mg by mouth daily.        Marland Kitchen triamcinolone (KENALOG) 0.1 % cream Daily.      Marland Kitchen ibuprofen (ADVIL,MOTRIN) 400 MG tablet as needed.        Allergies  Allergen Reactions  . Avalide (Irbesartan-Hydrochlorothiazide)     Apparently Caused an increase in Creatinine  . Cymbalta  (Duloxetine Hcl)     Anaphylactic    . Escitalopram Oxalate     Hives  . Fenofibrate     Intolerant     Past Medical History  Diagnosis Date  . Allergy   . Hypertension   . Psoriasis   . Hearing loss   . Inguinal hernia September 2012    Bilateral   . Hemorrhoids 1979  . Skin cancer   . Coronary disease     with remote inferior myocardial infarction. Documented occlusion of the right  coronary  . Dyslipidemia     Combined with hx of marked hypertriglyceridemia  . Osteoarthritis   . Myocardial infarction, old     Remote inferior MI with known occlusion of the RCA.   Marland Kitchen CRI (chronic renal insufficiency)     Past Surgical History  Procedure Date  . Hemorroidectomy   . Cardiac catheterization     Hx of obstructive single-vessel CAD by Cardiac Cath on January 13, 2004   . Inguinal hernia repair 03/11/2011    bilateral  . Cataract     with lens implants    History  Smoking status  . Never Smoker   Smokeless tobacco  .  Not on file    History  Alcohol Use No    Family History  Problem Relation Age of Onset  . Cancer Brother   . Cancer Brother     prostate and skin    Review of Systems: The review of systems is positive for decreased hearing.  All other systems were reviewed and are negative.  Physical Exam: BP 130/84  Pulse 60  Ht 5\' 11"  (1.803 m)  Wt 159 lb (72.122 kg)  BMI 22.18 kg/m2 Patient is very pleasant and in no acute distress. Skin is warm and dry. Color is normal.  HEENT is unremarkable. Normocephalic/atraumatic. PERRL. Sclera are nonicteric. Neck is supple. No masses. No JVD. Lungs are clear. Cardiac exam shows a regular rate and rhythm. Abdomen is soft. Extremities are without edema. Pedal pulses are excellent. Gait and ROM are intact. Cranial nerves II through XII are intact. No gross neurologic deficits noted.   LABORATORY DATA:   Assessment / Plan:

## 2012-05-10 ENCOUNTER — Telehealth: Payer: Self-pay | Admitting: Cardiology

## 2012-05-10 NOTE — Telephone Encounter (Signed)
Patient called spoke to wife was told crestor 10 mg samples left at 3rd floor rfront desk desk.

## 2012-05-10 NOTE — Telephone Encounter (Signed)
New problem:    Samples of crestor.

## 2012-05-23 ENCOUNTER — Ambulatory Visit: Payer: Medicare Other | Admitting: Cardiology

## 2012-05-24 ENCOUNTER — Encounter: Payer: Self-pay | Admitting: Cardiology

## 2012-05-24 ENCOUNTER — Ambulatory Visit (INDEPENDENT_AMBULATORY_CARE_PROVIDER_SITE_OTHER): Payer: Medicare Other | Admitting: Cardiology

## 2012-05-24 VITALS — BP 132/86 | HR 56 | Ht 71.0 in | Wt 167.1 lb

## 2012-05-24 DIAGNOSIS — E785 Hyperlipidemia, unspecified: Secondary | ICD-10-CM

## 2012-05-24 DIAGNOSIS — I259 Chronic ischemic heart disease, unspecified: Secondary | ICD-10-CM

## 2012-05-24 DIAGNOSIS — I10 Essential (primary) hypertension: Secondary | ICD-10-CM

## 2012-05-24 DIAGNOSIS — I2510 Atherosclerotic heart disease of native coronary artery without angina pectoris: Secondary | ICD-10-CM

## 2012-05-24 NOTE — Patient Instructions (Addendum)
Continue your current therapy. You may reduce fish oil to once a day.  I will see you in 1 year

## 2012-05-24 NOTE — Progress Notes (Signed)
[name] Brennan Date of Birth: 1940/07/10 Medical Record #[id number]  History of Present Illness: [name] Brennan is seen for followup today for coronary disease. He had a cardiac catheterization in 2005 which showed an occluded right coronary with collateral flow. He has been managed medically. He has done very well since his appointment in June. He denies any symptoms of chest pain, shortness of breath, or palpitations. He has had no other medical problems.  Current Outpatient Prescriptions on File Prior to Visit  Medication Sig Dispense Refill  . amLODipine (NORVASC) 5 MG tablet Take 1 tablet (5 mg total) by mouth 2 (two) times daily.  180 tablet  2  . aspirin 81 MG tablet Take 81 mg by mouth daily.        . fenofibrate 160 MG tablet TAKE 1 TABLET BY MOUTH EVERY DAY  30 tablet  5  . fish oil-omega-3 fatty acids 1000 MG capsule Take 1,000-2,000 mg by mouth 2 (two) times daily.       Marland Kitchen gabapentin (NEURONTIN) 600 MG tablet Take 600 mg by mouth as needed.        . GuaiFENesin (MUCUS RELIEF ADULT PO) Take by mouth as needed.       Marland Kitchen lisinopril (PRINIVIL,ZESTRIL) 10 MG tablet Take 1 tablet (10 mg total) by mouth 2 (two) times daily.  180 tablet  2  . metoprolol (LOPRESSOR) 50 MG tablet Take 1 tablet (50 mg total) by mouth daily.  90 tablet  2  . niacin 500 MG CR capsule Take 500 mg by mouth at bedtime.        . rosuvastatin (CRESTOR) 10 MG tablet Take 10 mg by mouth daily.        Marland Kitchen triamcinolone (KENALOG) 0.1 % cream Daily.        Allergies  Allergen Reactions  . Avalide (Irbesartan-Hydrochlorothiazide)     Apparently Caused an increase in Creatinine  . Cymbalta (Duloxetine Hcl)     Anaphylactic    . Escitalopram Oxalate     Hives  . Fenofibrate     Intolerant     Past Medical History  Diagnosis Date  . Allergy   . Hypertension   . Psoriasis   . Hearing loss   . Inguinal hernia September 2012    Bilateral   . Hemorrhoids 1979  . Skin cancer   . Coronary disease     with remote inferior  myocardial infarction. Documented occlusion of the right  coronary  . Dyslipidemia     Combined with hx of marked hypertriglyceridemia  . Osteoarthritis   . Myocardial infarction, old     Remote inferior MI with known occlusion of the RCA.   Marland Kitchen CRI (chronic renal insufficiency)     Past Surgical History  Procedure Date  . Hemorroidectomy   . Cardiac catheterization     Hx of obstructive single-vessel CAD by Cardiac Cath on January 13, 2004   . Inguinal hernia repair 03/11/2011    bilateral  . Cataract     with lens implants    History  Smoking status  . Never Smoker   Smokeless tobacco  . Not on file    History  Alcohol Use No    Family History  Problem Relation Age of Onset  . Cancer Brother   . Cancer Brother     prostate and skin    Review of Systems: The review of systems is positive for decreased hearing.  All other systems were reviewed and are negative.  Physical Exam: BP 132/86  Pulse 56  Ht 5\' 11"  (1.803 m)  Wt 167 lb 1.9 oz (75.805 kg)  BMI 23.31 kg/m2 Patient is very pleasant and in no acute distress. Skin is warm and dry. Color is normal.  HEENT is unremarkable. Normocephalic/atraumatic. PERRL. Sclera are nonicteric. Neck is supple. No masses. No JVD. Lungs are clear. Cardiac exam shows a regular rate and rhythm. Abdomen is soft. Extremities are without edema. Pedal pulses are excellent. Gait and ROM are intact. Cranial nerves II through XII are intact. No gross neurologic deficits noted.   LABORATORY DATA: ECG today demonstrates normal sinus rhythm with a nonspecific T-wave abnormality.  Assessment / Plan: 1. Coronary disease with chronic occlusion of the right coronary. He is asymptomatic. We will continue with his current therapy including aspirin, amlodipine, and metoprolol. Patient has refused stress testing in the past. We will continue to monitor for any new symptoms.  2. Hypertension, controlled on metoprolol, amlodipine, and lisinopril.  3.  Hyperlipidemia on Crestor.

## 2012-05-30 NOTE — Addendum Note (Signed)
Addended by: Kyla Balzarine A on: 05/30/2012 02:32 PM   Modules accepted: Orders

## 2012-06-01 NOTE — Addendum Note (Signed)
Addended by: Kyla Balzarine A on: 06/01/2012 01:20 PM   Modules accepted: Orders

## 2012-09-03 ENCOUNTER — Other Ambulatory Visit: Payer: Self-pay | Admitting: Cardiology

## 2012-09-05 ENCOUNTER — Other Ambulatory Visit: Payer: Self-pay | Admitting: *Deleted

## 2012-09-05 MED ORDER — METOPROLOL TARTRATE 50 MG PO TABS: 50.0000 mg | ORAL_TABLET | Freq: Every day | ORAL | Status: DC

## 2012-11-01 ENCOUNTER — Other Ambulatory Visit: Payer: Self-pay | Admitting: Cardiology

## 2013-04-17 ENCOUNTER — Other Ambulatory Visit: Payer: Self-pay | Admitting: Cardiology

## 2013-05-24 ENCOUNTER — Ambulatory Visit (INDEPENDENT_AMBULATORY_CARE_PROVIDER_SITE_OTHER): Payer: Medicare Other | Admitting: Cardiology

## 2013-05-24 ENCOUNTER — Encounter: Payer: Self-pay | Admitting: Cardiology

## 2013-05-24 VITALS — BP 124/89 | HR 55 | Ht 68.0 in | Wt 167.0 lb

## 2013-05-24 DIAGNOSIS — I259 Chronic ischemic heart disease, unspecified: Secondary | ICD-10-CM

## 2013-05-24 DIAGNOSIS — I10 Essential (primary) hypertension: Secondary | ICD-10-CM

## 2013-05-24 DIAGNOSIS — E785 Hyperlipidemia, unspecified: Secondary | ICD-10-CM

## 2013-05-24 DIAGNOSIS — I2510 Atherosclerotic heart disease of native coronary artery without angina pectoris: Secondary | ICD-10-CM

## 2013-05-24 NOTE — Progress Notes (Addendum)
[name] Brennan Date of Birth: [birth year] Medical Record #[id number]  History of Present Illness: [name] Brennan is seen for followup today for coronary disease. He had a cardiac catheterization in 2005 which showed an occluded right coronary with collateral flow. He has been managed medically. He has done very well since then. He denies any symptoms of chest pain, shortness of breath, or palpitations. He has had no other medical problems. He states that he is exercising regularly.  Current Outpatient Prescriptions on File Prior to Visit  Medication Sig Dispense Refill  . amLODipine (NORVASC) 5 MG tablet TAKE 1 TABLET BY MOUTH TWICE A DAY  180 tablet  0  . aspirin 81 MG tablet Take 81 mg by mouth daily.        . fenofibrate 160 MG tablet TAKE 1 TABLET BY MOUTH EVERY DAY  30 tablet  5  . fish oil-omega-3 fatty acids 1000 MG capsule Take 1,000-2,000 mg by mouth 2 (two) times daily.       Marland Kitchen gabapentin (NEURONTIN) 600 MG tablet Take 600 mg by mouth as needed.        . GuaiFENesin (MUCUS RELIEF ADULT PO) Take by mouth as needed.       Marland Kitchen lisinopril (PRINIVIL,ZESTRIL) 10 MG tablet Take 1 tablet (10 mg total) by mouth 2 (two) times daily.  180 tablet  2  . metoprolol (LOPRESSOR) 50 MG tablet Take 1 tablet (50 mg total) by mouth daily.  90 tablet  3  . rosuvastatin (CRESTOR) 10 MG tablet Take 10 mg by mouth daily.        Marland Kitchen triamcinolone (KENALOG) 0.1 % cream Daily.       No current facility-administered medications on file prior to visit.    Allergies  Allergen Reactions  . Avalide [Irbesartan-Hydrochlorothiazide]     Apparently Caused an increase in Creatinine  . Cymbalta [Duloxetine Hcl]     Anaphylactic    . Escitalopram Oxalate     Hives  . Fenofibrate     Intolerant     Past Medical History  Diagnosis Date  . Allergy   . Hypertension   . Psoriasis   . Hearing loss   . Inguinal hernia September 2012    Bilateral   . Hemorrhoids 1979  . Skin cancer   . Coronary disease     with remote  inferior myocardial infarction. Documented occlusion of the right  coronary  . Dyslipidemia     Combined with hx of marked hypertriglyceridemia  . Osteoarthritis   . Myocardial infarction, old     Remote inferior MI with known occlusion of the RCA.   Marland Kitchen CRI (chronic renal insufficiency)     Past Surgical History  Procedure Laterality Date  . Hemorroidectomy    . Cardiac catheterization      Hx of obstructive single-vessel CAD by Cardiac Cath on January 13, 2004   . Inguinal hernia repair  03/11/2011    bilateral  . Cataract      with lens implants    History  Smoking status  . Never Smoker   Smokeless tobacco  . Not on file    History  Alcohol Use No    Family History  Problem Relation Age of Onset  . Cancer Brother   . Cancer Brother     prostate and skin    Review of Systems: The review of systems is positive for decreased hearing.  All other systems were reviewed and are negative.  Physical Exam: BP 124/89  Pulse 55  Ht 5\' 8"  (1.727 m)  Wt 167 lb (75.751 kg)  BMI 25.40 kg/m2 Patient is very pleasant and in no acute distress. Skin is warm and dry. Color is normal.  HEENT is unremarkable. Normocephalic/atraumatic. PERRL. Sclera are nonicteric. Neck is supple. No masses. No JVD. Lungs are clear. Cardiac exam shows a regular rate and rhythm. Abdomen is soft. Extremities are without edema. Pedal pulses are excellent. Gait and ROM are intact. Cranial nerves II through XII are intact. No gross neurologic deficits noted.   LABORATORY DATA: ECG today demonstrates normal sinus rhythm with a nonspecific T-wave abnormality. Possible old inferior infarct.  Assessment / Plan: 1. Coronary disease with chronic occlusion of the right coronary. He is asymptomatic. We will continue with his current therapy including aspirin, amlodipine, and metoprolol. Patient refuses followup stress testing. He states if it's not causing any problems there is no reason to do anything else.  2.  Hypertension, controlled on metoprolol, amlodipine, and lisinopril.  3. Hyperlipidemia on Crestor. I recommended stopping niacin. His lab work from last year showed a triglyceride level of 120. He is only taking 500 mg of niacin a day. Recent data suggests that there is no benefit to niacin over statin alone.

## 2013-05-24 NOTE — Patient Instructions (Signed)
You may stop Niacin now.  Continue your other therapy  I will see you in one year.

## 2014-06-18 ENCOUNTER — Ambulatory Visit: Payer: Medicare Other | Admitting: Cardiology

## 2014-07-22 ENCOUNTER — Ambulatory Visit (INDEPENDENT_AMBULATORY_CARE_PROVIDER_SITE_OTHER): Payer: Medicare Other | Admitting: Cardiology

## 2014-07-22 ENCOUNTER — Encounter: Payer: Self-pay | Admitting: Cardiology

## 2014-07-22 VITALS — BP 110/80 | HR 57 | Ht 71.0 in | Wt 176.0 lb

## 2014-07-22 DIAGNOSIS — I10 Essential (primary) hypertension: Secondary | ICD-10-CM

## 2014-07-22 DIAGNOSIS — E785 Hyperlipidemia, unspecified: Secondary | ICD-10-CM

## 2014-07-22 DIAGNOSIS — I2510 Atherosclerotic heart disease of native coronary artery without angina pectoris: Secondary | ICD-10-CM

## 2014-07-22 NOTE — Patient Instructions (Signed)
Continue your current therapy  Try and lose some weight  I will see you in one year.

## 2014-07-22 NOTE — Progress Notes (Signed)
[name] Brennan Date of Birth: [birth year] Medical Record #[id number]  History of Present Illness: [name] Brennan is seen for followup today for coronary disease. He had a cardiac catheterization in 2005 which showed an occluded right coronary with collateral flow. He has been managed medically. He has done very well since then. He denies any symptoms of chest pain, shortness of breath, or palpitations. He has not been exercising lately due to the weather and we note a 9 lb weight gain.    Current Outpatient Prescriptions on File Prior to Visit  Medication Sig Dispense Refill  . amLODipine (NORVASC) 5 MG tablet TAKE 1 TABLET BY MOUTH TWICE A DAY 180 tablet 0  . aspirin 81 MG tablet Take 81 mg by mouth daily.      . fenofibrate 160 MG tablet TAKE 1 TABLET BY MOUTH EVERY DAY 30 tablet 5  . fish oil-omega-3 fatty acids 1000 MG capsule Take 1,000-2,000 mg by mouth 2 (two) times daily.     Marland Kitchen gabapentin (NEURONTIN) 600 MG tablet Take 600 mg by mouth as needed.      . GuaiFENesin (MUCUS RELIEF ADULT PO) Take by mouth as needed.     Marland Kitchen lisinopril (PRINIVIL,ZESTRIL) 10 MG tablet Take 1 tablet (10 mg total) by mouth 2 (two) times daily. 180 tablet 2  . metoprolol (LOPRESSOR) 50 MG tablet Take 1 tablet (50 mg total) by mouth daily. 90 tablet 3  . rosuvastatin (CRESTOR) 10 MG tablet Take 10 mg by mouth daily.      Marland Kitchen triamcinolone (KENALOG) 0.1 % cream Daily.     No current facility-administered medications on file prior to visit.    Allergies  Allergen Reactions  . Avalide [Irbesartan-Hydrochlorothiazide]     Apparently Caused an increase in Creatinine  . Cymbalta [Duloxetine Hcl]     Anaphylactic    . Escitalopram Oxalate     Hives  . Fenofibrate     Intolerant     Past Medical History  Diagnosis Date  . Allergy   . Hypertension   . Psoriasis   . Hearing loss   . Inguinal hernia September 2012    Bilateral   . Hemorrhoids 1979  . Skin cancer   . Coronary disease     with remote inferior  myocardial infarction. Documented occlusion of the right  coronary  . Dyslipidemia     Combined with hx of marked hypertriglyceridemia  . Osteoarthritis   . Myocardial infarction, old     Remote inferior MI with known occlusion of the RCA.   Marland Kitchen CRI (chronic renal insufficiency)     Past Surgical History  Procedure Laterality Date  . Hemorroidectomy    . Cardiac catheterization      Hx of obstructive single-vessel CAD by Cardiac Cath on January 13, 2004   . Inguinal hernia repair  03/11/2011    bilateral  . Cataract      with lens implants    History  Smoking status  . Never Smoker   Smokeless tobacco  . Not on file    History  Alcohol Use No    Family History  Problem Relation Age of Onset  . Cancer Brother   . Cancer Brother     prostate and skin    Review of Systems: The review of systems is positive for decreased hearing.  All other systems were reviewed and are negative.  Physical Exam: BP 110/80 mmHg  Pulse 57  Ht 5\' 11"  (1.803 m)  Wt 176 lb (  79.833 kg)  BMI 24.56 kg/m2 Patient is very pleasant and in no acute distress. Skin is warm and dry. Color is normal.  HEENT is unremarkable. Normocephalic/atraumatic. PERRL. Sclera are nonicteric. Neck is supple. No masses. No JVD. Lungs are clear. Cardiac exam shows a regular rate and rhythm. Abdomen is soft. Extremities are without edema. Pedal pulses are excellent. Gait and ROM are intact. Cranial nerves II through XII are intact. No gross neurologic deficits noted.   LABORATORY DATA: ECG today demonstrates normal sinus rhythm with  old inferior infarct. I have personally reviewed and interpreted this study.   Assessment / Plan: 1. Coronary disease with chronic occlusion of the right coronary. He is asymptomatic. We will continue with his current therapy including aspirin, amlodipine, and metoprolol.   2. Hypertension, controlled on metoprolol, amlodipine, and lisinopril.  3. Hyperlipidemia on Crestor. I requested  a copy of his most recent labs with Dr. Tollie Pizza.

## 2015-07-25 ENCOUNTER — Ambulatory Visit: Payer: Medicare Other | Admitting: Cardiology

## 2015-09-26 ENCOUNTER — Encounter: Payer: Self-pay | Admitting: *Deleted

## 2015-09-30 ENCOUNTER — Ambulatory Visit (INDEPENDENT_AMBULATORY_CARE_PROVIDER_SITE_OTHER): Payer: Medicare Other | Admitting: Cardiology

## 2015-09-30 ENCOUNTER — Encounter: Payer: Self-pay | Admitting: Cardiology

## 2015-09-30 VITALS — BP 128/74 | HR 56 | Ht 71.0 in | Wt 181.0 lb

## 2015-09-30 DIAGNOSIS — I2510 Atherosclerotic heart disease of native coronary artery without angina pectoris: Secondary | ICD-10-CM

## 2015-09-30 DIAGNOSIS — I10 Essential (primary) hypertension: Secondary | ICD-10-CM | POA: Diagnosis not present

## 2015-09-30 DIAGNOSIS — E785 Hyperlipidemia, unspecified: Secondary | ICD-10-CM

## 2015-09-30 NOTE — Patient Instructions (Signed)
Continue your current therapy  I will see you in one year   

## 2015-10-01 NOTE — Progress Notes (Signed)
[name] Date of Birth: 02-02-1941 Medical Record [id number]  History of Present Illness: [name] Brennan is seen for followup today for coronary disease. He had a cardiac catheterization in 2005 which showed an occluded right coronary with collateral flow. He has been managed medically. He has done very well since then.  On follow up today he denies any symptoms of chest pain, shortness of breath, or palpitations. He is working in yard.   Current Outpatient Prescriptions on File Prior to Visit  Medication Sig Dispense Refill  . amLODipine (NORVASC) 5 MG tablet TAKE 1 TABLET BY MOUTH TWICE A DAY 180 tablet 0  . aspirin 81 MG tablet Take 81 mg by mouth daily.      . fenofibrate 160 MG tablet TAKE 1 TABLET BY MOUTH EVERY DAY 30 tablet 5  . fish oil-omega-3 fatty acids 1000 MG capsule Take 1,000-2,000 mg by mouth 2 (two) times daily.     Marland Kitchen gabapentin (NEURONTIN) 600 MG tablet Take 600 mg by mouth as needed.      . GuaiFENesin (MUCUS RELIEF ADULT PO) Take by mouth as needed.     Marland Kitchen lisinopril (PRINIVIL,ZESTRIL) 10 MG tablet Take 1 tablet (10 mg total) by mouth 2 (two) times daily. 180 tablet 2  . metoprolol (LOPRESSOR) 50 MG tablet Take 1 tablet (50 mg total) by mouth daily. 90 tablet 3  . rosuvastatin (CRESTOR) 10 MG tablet Take 10 mg by mouth daily.      Marland Kitchen triamcinolone (KENALOG) 0.1 % cream Daily.     No current facility-administered medications on file prior to visit.    Allergies  Allergen Reactions  . Avalide [Irbesartan-Hydrochlorothiazide]     Apparently Caused an increase in Creatinine  . Cymbalta [Duloxetine Hcl]     Anaphylactic    . Escitalopram Oxalate     Hives  . Fenofibrate     Intolerant     Past Medical History  Diagnosis Date  . Allergy   . Hypertension   . Psoriasis   . Hearing loss   . Inguinal hernia September 2012    Bilateral   . Hemorrhoids 1979  . Skin cancer   . Coronary disease     with remote inferior myocardial infarction. Documented occlusion of  the right  coronary  . Dyslipidemia     Combined with hx of marked hypertriglyceridemia  . Osteoarthritis   . Myocardial infarction, old     Remote inferior MI with known occlusion of the RCA.   Marland Kitchen CRI (chronic renal insufficiency)     Past Surgical History  Procedure Laterality Date  . Hemorroidectomy    . Cardiac catheterization      Hx of obstructive single-vessel CAD by Cardiac Cath on January 13, 2004   . Inguinal hernia repair  03/11/2011    bilateral  . Cataract      with lens implants    History  Smoking status  . Never Smoker   Smokeless tobacco  . Not on file    History  Alcohol Use No    Family History  Problem Relation Age of Onset  . Prostate cancer Brother   . Skin cancer Brother   . Heart attack Father 35  . Unexplained death Mother     Review of Systems: The review of systems is positive for decreased hearing.  All other systems were reviewed and are negative.  Physical Exam: BP 128/74 mmHg  Pulse 56  Ht 5\' 11"  (1.803 m)  Wt 82.101 kg (181  lb)  BMI 25.26 kg/m2 Patient is very pleasant and in no acute distress. Skin is warm and dry. Color is normal.  HEENT is unremarkable. Normocephalic/atraumatic. PERRL. Sclera are nonicteric. Neck is supple. No masses. No JVD. Lungs are clear. Cardiac exam shows a regular rate and rhythm. Abdomen is soft. Extremities are without edema. Pedal pulses are excellent. Gait and ROM are intact. Cranial nerves II through XII are intact. No gross neurologic deficits noted.   LABORATORY DATA: ECG today demonstrates normal sinus rhythm with  old inferior infarct. Rate 56. First degree AV block. I have personally reviewed and interpreted this study.  Labs reviewed from 05/28/15: BUN 37, creatinine 1.3, glucose 111. CBC normal.  Non HDL cholesterol 94.  Assessment / Plan: 1. Coronary disease with chronic occlusion of the right coronary with collaterals. He is asymptomatic. We will continue with his current therapy including  aspirin, amlodipine, and metoprolol.   2. Hypertension, controlled on metoprolol, amlodipine, and lisinopril.  3. Hyperlipidemia on Crestor. Last LDL at goal.

## 2016-10-04 ENCOUNTER — Ambulatory Visit: Payer: Medicare Other | Admitting: Cardiology

## 2016-11-18 NOTE — Progress Notes (Signed)
[name] Brennan Date of Birth: Nov 24, 1940 Medical Record #[id number]  History of Present Illness: [name] Brennan is seen for followup today for coronary disease. He had a cardiac catheterization in 2005 which showed an occluded right coronary with collateral flow. He has been managed medically. He has done very well since then.  On follow up today he denies any symptoms of chest pain, shortness of breath, or palpitations. He is active doing his own yard work. Denies any other health complaints this year other that sinus congestion with allergies.   Current Outpatient Prescriptions on File Prior to Visit  Medication Sig Dispense Refill  . amLODipine (NORVASC) 5 MG tablet TAKE 1 TABLET BY MOUTH TWICE A DAY 180 tablet 0  . aspirin 81 MG tablet Take 81 mg by mouth daily.      . fenofibrate 160 MG tablet TAKE 1 TABLET BY MOUTH EVERY DAY 30 tablet 5  . fish oil-omega-3 fatty acids 1000 MG capsule Take 1,000-2,000 mg by mouth 2 (two) times daily.     Marland Kitchen gabapentin (NEURONTIN) 600 MG tablet Take 600 mg by mouth as needed.      . GuaiFENesin (MUCUS RELIEF ADULT PO) Take by mouth as needed.     Marland Kitchen lisinopril (PRINIVIL,ZESTRIL) 10 MG tablet Take 1 tablet (10 mg total) by mouth 2 (two) times daily. 180 tablet 2  . metoprolol (LOPRESSOR) 50 MG tablet Take 1 tablet (50 mg total) by mouth daily. 90 tablet 3  . rosuvastatin (CRESTOR) 10 MG tablet Take 10 mg by mouth daily.      Marland Kitchen triamcinolone (KENALOG) 0.1 % cream Daily.     No current facility-administered medications on file prior to visit.     Allergies  Allergen Reactions  . Atorvastatin Swelling  . Avalide [Irbesartan-Hydrochlorothiazide]     Apparently Caused an increase in Creatinine  . Cymbalta [Duloxetine Hcl]     Anaphylactic    . Escitalopram Oxalate     Hives  . Fenofibrate     Intolerant     Past Medical History:  Diagnosis Date  . Allergy   . Coronary disease    with remote inferior myocardial infarction. Documented occlusion of the right   coronary  . CRI (chronic renal insufficiency)   . Dyslipidemia    Combined with hx of marked hypertriglyceridemia  . Hearing loss   . Hemorrhoids 1979  . Hypertension   . Inguinal hernia September 2012   Bilateral   . Myocardial infarction, old    Remote inferior MI with known occlusion of the RCA.   . Osteoarthritis   . Psoriasis   . Skin cancer     Past Surgical History:  Procedure Laterality Date  . CARDIAC CATHETERIZATION     Hx of obstructive single-vessel CAD by Cardiac Cath on January 13, 2004   . cataract     with lens implants  . HEMORROIDECTOMY    . INGUINAL HERNIA REPAIR  03/11/2011   bilateral    History  Smoking Status  . Never Smoker  Smokeless Tobacco  . Never Used    History  Alcohol Use No    Family History  Problem Relation Age of Onset  . Prostate cancer Brother   . Skin cancer Brother   . Heart attack Father 36  . Unexplained death Mother     Review of Systems: The review of systems is positive for decreased hearing.  All other systems were reviewed and are negative.  Physical Exam: BP 124/72   Pulse Marland Kitchen)  58   Ht 5\' 11"  (1.803 m)   Wt 178 lb 9.6 oz (81 kg)   BMI 24.91 kg/m  Patient is very pleasant and in no acute distress. Skin is warm and dry. Color is normal.  HEENT is unremarkable. Normocephalic/atraumatic. PERRL. Sclera are nonicteric. Neck is supple. No masses. No JVD. Lungs are clear. Cardiac exam shows a regular rate and rhythm. Abdomen is soft. Extremities are without edema. Pedal pulses are excellent. Gait and ROM are intact. Cranial nerves II through XII are intact. No gross neurologic deficits noted.   LABORATORY DATA: ECG today demonstrates normal sinus rhythm, Rate 58. First degree AV block. Nonspecific TWA. No change from last year.I have personally reviewed and interpreted this study.  Labs reviewed from 10/01/16: A1c 5.8%. BUN 26, creatinine 1.37, glucose 112. CBC normal.  Cholesterol 135, triglycerides 227, HDL 32,LDL  72. Other CMET normal.   Assessment / Plan: 1. Coronary disease with chronic occlusion of the right coronary with collaterals. He is asymptomatic. We will continue with his current therapy including aspirin, amlodipine, and metoprolol.   2. Hypertension, well controlled on metoprolol, amlodipine, and lisinopril.  3. Hyperlipidemia on Crestor. LDL looks very good.  Follow up in one year

## 2016-11-19 ENCOUNTER — Encounter: Payer: Self-pay | Admitting: Cardiology

## 2016-11-19 ENCOUNTER — Ambulatory Visit (INDEPENDENT_AMBULATORY_CARE_PROVIDER_SITE_OTHER): Payer: Medicare Other | Admitting: Cardiology

## 2016-11-19 VITALS — BP 124/72 | HR 58 | Ht 71.0 in | Wt 178.6 lb

## 2016-11-19 DIAGNOSIS — E785 Hyperlipidemia, unspecified: Secondary | ICD-10-CM | POA: Diagnosis not present

## 2016-11-19 DIAGNOSIS — I10 Essential (primary) hypertension: Secondary | ICD-10-CM | POA: Diagnosis not present

## 2016-11-19 DIAGNOSIS — I2510 Atherosclerotic heart disease of native coronary artery without angina pectoris: Secondary | ICD-10-CM | POA: Diagnosis not present

## 2016-11-19 NOTE — Patient Instructions (Signed)
Continue your current therapy  I will see you in one year   

## 2016-11-19 NOTE — Addendum Note (Signed)
Addended by: Kathyrn Lass on: 11/19/2016 04:31 PM   Modules accepted: Orders

## 2017-11-23 ENCOUNTER — Ambulatory Visit: Payer: Medicare Other | Admitting: Cardiology

## 2017-11-29 NOTE — Progress Notes (Signed)
[name] Brennan Date of Birth: [birth year] Medical Record #[id number]  History of Present Illness: [name] Brennan is seen for followup today for coronary disease. He had a cardiac catheterization in 2005 which showed an occluded right coronary with collateral flow. He has been managed medically. He has done very well since then.  On follow up today he denies any symptoms of chest pain, shortness of breath, or palpitations. He has cut back on his walking since he had a bad fall last year. Still does his own yard work.   Current Outpatient Medications on File Prior to Visit  Medication Sig Dispense Refill  . amLODipine (NORVASC) 5 MG tablet TAKE 1 TABLET BY MOUTH TWICE A DAY 180 tablet 0  . aspirin 81 MG tablet Take 81 mg by mouth daily.      . fenofibrate 160 MG tablet TAKE 1 TABLET BY MOUTH EVERY DAY 30 tablet 5  . fish oil-omega-3 fatty acids 1000 MG capsule Take 1 g by mouth daily.     Marland Kitchen gabapentin (NEURONTIN) 600 MG tablet Take 600 mg by mouth as needed.      . GuaiFENesin (MUCUS RELIEF ADULT PO) Take by mouth as needed.     Marland Kitchen lisinopril (PRINIVIL,ZESTRIL) 10 MG tablet Take 1 tablet (10 mg total) by mouth 2 (two) times daily. 180 tablet 2  . metoprolol (LOPRESSOR) 50 MG tablet Take 1 tablet (50 mg total) by mouth daily. 90 tablet 3  . rosuvastatin (CRESTOR) 10 MG tablet Take 10 mg by mouth daily.      Marland Kitchen triamcinolone (KENALOG) 0.1 % cream Daily.     No current facility-administered medications on file prior to visit.     Allergies  Allergen Reactions  . Atorvastatin Swelling  . Avalide [Irbesartan-Hydrochlorothiazide]     Apparently Caused an increase in Creatinine  . Cymbalta [Duloxetine Hcl]     Anaphylactic    . Escitalopram Oxalate     Hives  . Fenofibrate     Intolerant     Past Medical History:  Diagnosis Date  . Allergy   . Coronary disease    with remote inferior myocardial infarction. Documented occlusion of the right  coronary  . CRI (chronic renal insufficiency)   .  Dyslipidemia    Combined with hx of marked hypertriglyceridemia  . Hearing loss   . Hemorrhoids 1979  . Hypertension   . Inguinal hernia September 2012   Bilateral   . Myocardial infarction, old    Remote inferior MI with known occlusion of the RCA.   . Osteoarthritis   . Psoriasis   . Skin cancer     Past Surgical History:  Procedure Laterality Date  . CARDIAC CATHETERIZATION     Hx of obstructive single-vessel CAD by Cardiac Cath on January 13, 2004   . cataract     with lens implants  . HEMORROIDECTOMY    . INGUINAL HERNIA REPAIR  03/11/2011   bilateral    Social History   Tobacco Use  Smoking Status Never Smoker  Smokeless Tobacco Never Used    Social History   Substance and Sexual Activity  Alcohol Use No    Family History  Problem Relation Age of Onset  . Prostate cancer Brother   . Skin cancer Brother   . Heart attack Father 84  . Unexplained death Mother     Review of Systems: The review of systems is positive for decreased hearing.  All other systems were reviewed and are negative.  Physical Exam:  BP 140/82   Pulse 68   Ht 5\' 11"  (1.803 m)   Wt 173 lb (78.5 kg)   BMI 24.13 kg/m  GENERAL:  Well appearing WM in NAD HEENT:  PERRL, EOMI, sclera are clear. Oropharynx is clear. NECK:  No jugular venous distention, carotid upstroke brisk and symmetric, no bruits, no thyromegaly or adenopathy LUNGS:  Clear to auscultation bilaterally CHEST:  Unremarkable HEART:  RRR,  PMI not displaced or sustained,S1 and S2 within normal limits, no S3, no S4: no clicks, no rubs, no murmurs ABD:  Soft, nontender. BS +, no masses or bruits. No hepatomegaly, no splenomegaly EXT:  2 + pulses throughout, no edema, no cyanosis no clubbing SKIN:  Warm and dry.  No rashes NEURO:  Alert and oriented x 3. Cranial nerves II through XII intact. PSYCH:  Cognitively intact     LABORATORY DATA: ECG today demonstrates normal sinus rhythm, Rate 68. Normal Ecg. I have personally  reviewed and interpreted this study.  Labs reviewed from 10/01/16: A1c 5.8%. BUN 26, creatinine 1.37, glucose 112. CBC normal.  Cholesterol 135, triglycerides 227, HDL 32,LDL 72. Other CMET normal.  Dated 10/06/17: BUN 37, creatinine 1.47. Calcium 10.8. Other chemistries normal. Cholesterol 145, triglycerides 271. LDL 75, HDL 32. CBC normal.  Assessment / Plan: 1. Coronary disease with chronic occlusion of the right coronary with collaterals. He remains  asymptomatic. We will continue  aspirin, amlodipine, and metoprolol.   2. Hypertension, well controlled on metoprolol, amlodipine, and lisinopril.  3. Hyperlipidemia on Crestor. LDL is close to goal of 70. Triglycerides are elevated. On fish oil and fenofibrate. Encourage heart healthy diet and increased activity.  Follow up in one year

## 2017-12-01 ENCOUNTER — Encounter: Payer: Self-pay | Admitting: Cardiology

## 2017-12-01 ENCOUNTER — Ambulatory Visit: Payer: Medicare Other | Admitting: Cardiology

## 2017-12-01 VITALS — BP 140/82 | HR 68 | Ht 71.0 in | Wt 173.0 lb

## 2017-12-01 DIAGNOSIS — I10 Essential (primary) hypertension: Secondary | ICD-10-CM | POA: Diagnosis not present

## 2017-12-01 DIAGNOSIS — I2510 Atherosclerotic heart disease of native coronary artery without angina pectoris: Secondary | ICD-10-CM | POA: Diagnosis not present

## 2017-12-01 DIAGNOSIS — E785 Hyperlipidemia, unspecified: Secondary | ICD-10-CM | POA: Diagnosis not present

## 2017-12-01 NOTE — Patient Instructions (Signed)
Continue current therapy  Try and stay active.

## 2018-02-06 ENCOUNTER — Emergency Department (HOSPITAL_COMMUNITY): Payer: Medicare Other

## 2018-02-06 ENCOUNTER — Observation Stay (HOSPITAL_COMMUNITY)
Admission: EM | Admit: 2018-02-06 | Discharge: 2018-02-07 | Disposition: A | Discharge status: Home / Self Care | Payer: Medicare Other | Attending: Student in an Organized Health Care Education/Training Program | Admitting: Student in an Organized Health Care Education/Training Program

## 2018-02-06 ENCOUNTER — Encounter (HOSPITAL_COMMUNITY): Payer: Self-pay | Admitting: Emergency Medicine

## 2018-02-06 DIAGNOSIS — Z85828 Personal history of other malignant neoplasm of skin: Secondary | ICD-10-CM | POA: Insufficient documentation

## 2018-02-06 DIAGNOSIS — Z7982 Long term (current) use of aspirin: Secondary | ICD-10-CM | POA: Insufficient documentation

## 2018-02-06 DIAGNOSIS — I2510 Atherosclerotic heart disease of native coronary artery without angina pectoris: Secondary | ICD-10-CM | POA: Insufficient documentation

## 2018-02-06 DIAGNOSIS — Z79899 Other long term (current) drug therapy: Secondary | ICD-10-CM | POA: Diagnosis not present

## 2018-02-06 DIAGNOSIS — B370 Candidal stomatitis: Secondary | ICD-10-CM | POA: Diagnosis not present

## 2018-02-06 DIAGNOSIS — I10 Essential (primary) hypertension: Secondary | ICD-10-CM | POA: Insufficient documentation

## 2018-02-06 DIAGNOSIS — E860 Dehydration: Secondary | ICD-10-CM

## 2018-02-06 DIAGNOSIS — R531 Weakness: Secondary | ICD-10-CM

## 2018-02-06 DIAGNOSIS — N179 Acute kidney failure, unspecified: Secondary | ICD-10-CM

## 2018-02-06 DIAGNOSIS — R638 Other symptoms and signs concerning food and fluid intake: Secondary | ICD-10-CM | POA: Diagnosis present

## 2018-02-06 LAB — COMPREHENSIVE METABOLIC PANEL
ALBUMIN: 3.7 g/dL (ref 3.5–5.0)
ALT: 30 U/L (ref 0–44)
ANION GAP: 10 (ref 5–15)
AST: 45 U/L — ABNORMAL HIGH (ref 15–41)
Alkaline Phosphatase: 112 U/L (ref 38–126)
BILIRUBIN TOTAL: 1.4 mg/dL — AB (ref 0.3–1.2)
BUN: 60 mg/dL — ABNORMAL HIGH (ref 8–23)
CALCIUM: 11 mg/dL — AB (ref 8.9–10.3)
CO2: 22 mmol/L (ref 22–32)
Chloride: 106 mmol/L (ref 98–111)
Creatinine, Ser: 1.51 mg/dL — ABNORMAL HIGH (ref 0.61–1.24)
GFR calc Af Amer: 49 mL/min — ABNORMAL LOW (ref >60)
GFR calc non Af Amer: 42 mL/min — ABNORMAL LOW (ref >60)
GLUCOSE: 135 mg/dL — AB (ref 70–99)
Potassium: 4.9 mmol/L (ref 3.5–5.1)
Sodium: 138 mmol/L (ref 135–145)
TOTAL PROTEIN: 8.2 g/dL — AB (ref 6.5–8.1)

## 2018-02-06 LAB — CBC WITH DIFFERENTIAL/PLATELET
ABS IMMATURE GRANULOCYTES: 0.2 10*3/uL — AB (ref 0.0–0.1)
BASOS ABS: 0.1 10*3/uL (ref 0.0–0.1)
BASOS PCT: 1 %
EOS ABS: 0.2 10*3/uL (ref 0.0–0.7)
EOS PCT: 1 %
HCT: 47.8 % (ref 39.0–52.0)
Hemoglobin: 15 g/dL (ref 13.0–17.0)
Immature Granulocytes: 2 %
Lymphocytes Relative: 10 %
Lymphs Abs: 1.4 10*3/uL (ref 0.7–4.0)
MCH: 29.4 pg (ref 26.0–34.0)
MCHC: 31.4 g/dL (ref 30.0–36.0)
MCV: 93.5 fL (ref 78.0–100.0)
MONO ABS: 1.6 10*3/uL — AB (ref 0.1–1.0)
Monocytes Relative: 11 %
NEUTROS ABS: 11.2 10*3/uL — AB (ref 1.7–7.7)
Neutrophils Relative %: 77 %
PLATELETS: 297 10*3/uL (ref 150–400)
RBC: 5.11 MIL/uL (ref 4.22–5.81)
RDW: 13.5 % (ref 11.5–15.5)
WBC: 14.7 10*3/uL — ABNORMAL HIGH (ref 4.0–10.5)

## 2018-02-06 LAB — URINALYSIS, ROUTINE W REFLEX MICROSCOPIC
BILIRUBIN URINE: NEGATIVE
Glucose, UA: NEGATIVE mg/dL
HGB URINE DIPSTICK: NEGATIVE
Ketones, ur: NEGATIVE mg/dL
Leukocytes, UA: NEGATIVE
Nitrite: NEGATIVE
PROTEIN: NEGATIVE mg/dL
Specific Gravity, Urine: 1.018 (ref 1.005–1.030)
pH: 5 (ref 5.0–8.0)

## 2018-02-06 LAB — HEMOGLOBIN A1C
Hgb A1c MFr Bld: 5.8 % — ABNORMAL HIGH (ref 4.8–5.6)
MEAN PLASMA GLUCOSE: 119.76 mg/dL

## 2018-02-06 LAB — I-STAT CG4 LACTIC ACID, ED
LACTIC ACID, VENOUS: 1.33 mmol/L (ref 0.5–1.9)
Lactic Acid, Venous: 1.86 mmol/L (ref 0.5–1.9)

## 2018-02-06 LAB — TSH: TSH: 0.493 u[IU]/mL (ref 0.350–4.500)

## 2018-02-06 MED ORDER — FLUCONAZOLE 100 MG PO TABS
400.0000 mg | ORAL_TABLET | Freq: Every day | ORAL | Status: DC
  Administered 2018-02-06 – 2018-02-07 (×2): 400 mg via ORAL
  Filled 2018-02-06 (×2): qty 4

## 2018-02-06 MED ORDER — METOPROLOL TARTRATE 50 MG PO TABS
50.0000 mg | ORAL_TABLET | Freq: Every day | ORAL | Status: DC
  Administered 2018-02-06: 50 mg via ORAL
  Filled 2018-02-06: qty 2

## 2018-02-06 MED ORDER — SODIUM CHLORIDE 0.9 % IV BOLUS
1000.0000 mL | Freq: Once | INTRAVENOUS | Status: AC
  Administered 2018-02-06: 1000 mL via INTRAVENOUS

## 2018-02-06 MED ORDER — ENSURE ENLIVE PO LIQD
237.0000 mL | Freq: Two times a day (BID) | ORAL | Status: DC
  Administered 2018-02-07 (×2): 237 mL via ORAL
  Filled 2018-02-06: qty 237

## 2018-02-06 MED ORDER — SODIUM CHLORIDE 0.9 % IV BOLUS
500.0000 mL | Freq: Once | INTRAVENOUS | Status: AC
  Administered 2018-02-06: 500 mL via INTRAVENOUS

## 2018-02-06 MED ORDER — ASPIRIN EC 81 MG PO TBEC
81.0000 mg | DELAYED_RELEASE_TABLET | Freq: Every day | ORAL | Status: DC
  Administered 2018-02-07: 81 mg via ORAL
  Filled 2018-02-06: qty 1

## 2018-02-06 MED ORDER — ENOXAPARIN SODIUM 30 MG/0.3ML ~~LOC~~ SOLN
30.0000 mg | SUBCUTANEOUS | Status: DC
  Administered 2018-02-07: 30 mg via SUBCUTANEOUS
  Filled 2018-02-06 (×2): qty 0.3

## 2018-02-06 MED ORDER — FLUCONAZOLE 100 MG PO TABS
200.0000 mg | ORAL_TABLET | Freq: Once | ORAL | Status: AC
  Administered 2018-02-06: 200 mg via ORAL
  Filled 2018-02-06: qty 2

## 2018-02-06 MED ORDER — AMLODIPINE BESYLATE 5 MG PO TABS
5.0000 mg | ORAL_TABLET | Freq: Every day | ORAL | Status: DC
  Administered 2018-02-06 – 2018-02-07 (×2): 5 mg via ORAL
  Filled 2018-02-06 (×2): qty 1

## 2018-02-06 NOTE — ED Triage Notes (Signed)
Pt to ER for evaluation of 3 weeks loss of appetite and productive cough. Pt denies pain.

## 2018-02-06 NOTE — ED Provider Notes (Signed)
[facility] EMERGENCY DEPARTMENT Provider Note   CSN: [id number] Arrival date & time: 02/06/18  6160     History   Chief Complaint Chief Complaint  Patient presents with  . Anorexia    HPI [name] Brennan is a 77 y.o. male.  The history is provided by the patient. No language interpreter was used.  Weakness  Primary symptoms include dizziness. This is a new problem. The current episode started more than 2 days ago. The problem has been gradually worsening. There was no focality noted. There has been no fever. There were no medications administered prior to arrival.  Pt has been on amoxicillin for a month for a sinus infection.  Pt was on 31mox 500tid but was recenlty increased to 1000 bid.  Pt has been having mouth pain.  Pt has not been eating or drinking for over a week due to pain.   Past Medical History:  Diagnosis Date  . Allergy   . Coronary disease    with remote inferior myocardial infarction. Documented occlusion of the right  coronary  . CRI (chronic renal insufficiency)   . Dyslipidemia    Combined with hx of marked hypertriglyceridemia  . Hearing loss   . Hemorrhoids 1979  . Hypertension   . Inguinal hernia September 2012   Bilateral   . Myocardial infarction, old    Remote inferior MI with known occlusion of the RCA.   . Osteoarthritis   . Psoriasis   . Skin cancer     Patient Active Problem List   Diagnosis Date Noted  . Atherosclerotic heart disease   . Hypertension   . Coronary disease   . Dyslipidemia   . History of bilateral inguinal hernia repair 03/24/2011    Past Surgical History:  Procedure Laterality Date  . CARDIAC CATHETERIZATION     Hx of obstructive single-vessel CAD by Cardiac Cath on January 13, 2004   . cataract     with lens implants  . HEMORROIDECTOMY    . INGUINAL HERNIA REPAIR  03/11/2011   bilateral        Home Medications    Prior to Admission medications   Medication Sig Start Date End Date Taking?  Authorizing Provider  amLODipine (NORVASC) 5 MG tablet TAKE 1 TABLET BY MOUTH TWICE A DAY 11/01/12  Yes Martinique, Peter M, MD  aspirin 81 MG tablet Take 81 mg by mouth daily.     Yes [provider]  fenofibrate 160 MG tablet TAKE 1 TABLET BY MOUTH EVERY DAY 10/14/10  Yes Martinique, Peter M, MD  gabapentin (NEURONTIN) 600 MG tablet Take 600 mg by mouth 3 (three) times daily as needed (nerve pain).    Yes [provider]  GuaiFENesin (MUCUS RELIEF ADULT PO) Take by mouth as needed.    Yes [provider]  lisinopril (PRINIVIL,ZESTRIL) 10 MG tablet Take 1 tablet (10 mg total) by mouth 2 (two) times daily. 09/10/11  Yes Martinique, Peter M, MD  metoprolol (LOPRESSOR) 50 MG tablet Take 1 tablet (50 mg total) by mouth daily. 09/05/12  Yes Martinique, Peter M, MD  rosuvastatin (CRESTOR) 10 MG tablet Take 10 mg by mouth daily.     Yes [provider]  sennosides-docusate sodium (SENOKOT-S) 8.6-50 MG tablet Take 1 tablet by mouth 2 (two) times daily.   Yes [provider]  triamcinolone (KENALOG) 0.1 % cream Apply 1 application topically daily as needed (rash).  11/12/10  Yes [provider]  amoxicillin (AMOXIL) 500  MG capsule Take 1,000 mg by mouth 2 (two) times daily. 01/27/18   [provider]    Family History Family History  Problem Relation Age of Onset  . Unexplained death Mother   . Heart attack Father 24  . Prostate cancer Brother   . Skin cancer Brother     Social History Social History   Tobacco Use  . Smoking status: Never Smoker  . Smokeless tobacco: Never Used  Substance Use Topics  . Alcohol use: No  . Drug use: No     Allergies   Amoxicillin; Atorvastatin; Avalide [irbesartan-hydrochlorothiazide]; Cymbalta [duloxetine hcl]; Escitalopram oxalate; and Fenofibrate   Review of Systems Review of Systems  HENT: Positive for sinus pressure and sore throat.   Neurological: Positive for dizziness and weakness.  All other systems  reviewed and are negative.    Physical Exam Updated Vital Signs BP (!) 140/97 (BP Location: Right Arm)   Pulse 98   Temp 97.6 F (36.4 C) (Oral)   Resp (!) 22   SpO2 100%   Physical Exam  Constitutional: He appears well-developed and well-nourished.  HENT:  Head: Normocephalic and atraumatic.  Right Ear: External ear normal.  Left Ear: External ear normal.  Nose: Nose normal.  Mouth/Throat: Oropharynx is clear and moist.  Mouth dry lips,  Mouth white plaques of thrush.   Eyes: Pupils are equal, round, and reactive to light. Conjunctivae and EOM are normal.  Neck: Neck supple.  Cardiovascular: Normal rate and regular rhythm.  No murmur heard. Pulmonary/Chest: Effort normal and breath sounds normal. No respiratory distress.  Abdominal: Soft. There is no tenderness.  Musculoskeletal: He exhibits no edema.  Neurological: He is alert.  Skin: Skin is warm and dry.  Psychiatric: He has a normal mood and affect.  Nursing note and vitals reviewed.    ED Treatments / Results  Labs (all labs ordered are listed, but only abnormal results are displayed) Labs Reviewed  COMPREHENSIVE METABOLIC PANEL - Abnormal; Notable for the following components:      Result Value   Glucose, Bld 135 (*)    BUN 60 (*)    Creatinine, Ser 1.51 (*)    Calcium 11.0 (*)    Total Protein 8.2 (*)    AST 45 (*)    Total Bilirubin 1.4 (*)    GFR calc non Af Amer 42 (*)    GFR calc Af Amer 49 (*)    All other components within normal limits  CBC WITH DIFFERENTIAL/PLATELET - Abnormal; Notable for the following components:   WBC 14.7 (*)    Neutro Abs 11.2 (*)    Monocytes Absolute 1.6 (*)    Abs Immature Granulocytes 0.2 (*)    All other components within normal limits  URINALYSIS, ROUTINE W REFLEX MICROSCOPIC  I-STAT CG4 LACTIC ACID, ED  I-STAT CG4 LACTIC ACID, ED    EKG EKG Interpretation  Date/Time:  Monday February 06 2018 10:06:59 EDT Ventricular Rate:  101 PR Interval:  188 QRS  Duration: 88 QT Interval:  340 QTC Calculation: 440 R Axis:   -67 Text Interpretation:  Sinus tachycardia with Premature atrial complexes Left anterior fascicular block Inferior infarct , age undetermined Cannot rule out Anterior infarct , age undetermined No significant change since last tracing Confirmed by Blanchie Dessert [phone]) on 02/06/2018 11:03:16 AM   Radiology Dg Chest 2 View  Result Date: 02/06/2018 CLINICAL DATA:  Productive cough, shortness of breath. EXAM: CHEST - 2 VIEW COMPARISON:  Radiographs of March 04, 2011.  FINDINGS: The heart size and mediastinal contours are within normal limits. Both lungs are clear. No pneumothorax or pleural effusion is noted. The visualized skeletal structures are unremarkable. IMPRESSION: No active cardiopulmonary disease. Electronically Signed   By: Marijo Conception, M.D.   On: 02/06/2018 12:29   Ct Head Wo Contrast  Result Date: 02/06/2018 CLINICAL DATA:  Altered mental status.  Headache and facial pain. EXAM: CT HEAD WITHOUT CONTRAST CT MAXILLOFACIAL WITHOUT CONTRAST TECHNIQUE: Multidetector CT imaging of the head and maxillofacial structures were performed using the standard protocol without intravenous contrast. Multiplanar CT image reconstructions of the maxillofacial structures were also generated. COMPARISON:  None. FINDINGS: CT HEAD FINDINGS Brain: Generalized atrophy. Background pattern of chronic small vessel ischemic changes throughout the white matter. Old infarction in the right middle cerebral artery territory with cortical and subcortical encephalomalacia. No identifiable acute infarction, mass lesion, hemorrhage, hydrocephalus or extra-axial collection. Vascular: There is atherosclerotic calcification of the major vessels at the base of the brain. Skull: Normal Other: None CT MAXILLOFACIAL FINDINGS Osseous: No facial fracture or acute bone lesion. The patient does have some dental decay. Orbits: Normal Sinuses: Clear.  No acute or  chronic inflammatory changes. Soft tissues: No soft tissue lesion of the face identified. IMPRESSION: Head CT: No acute finding. Atrophy and chronic small-vessel ischemic change. 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 Maxillofacial CT: No acute or traumatic finding. No evidence of sinusitis. No cause of facial pain identified. The patient does have some dental decay. Electronically Signed   By: Nelson Chimes M.D.   On: 02/06/2018 13:40   Ct Maxillofacial Wo Contrast  Result Date: 02/06/2018 CLINICAL DATA:  Altered mental status.  Headache and facial pain. EXAM: CT HEAD WITHOUT CONTRAST CT MAXILLOFACIAL WITHOUT CONTRAST TECHNIQUE: Multidetector CT imaging of the head and maxillofacial structures were performed using the standard protocol without intravenous contrast. Multiplanar CT image reconstructions of the maxillofacial structures were also generated. COMPARISON:  None. FINDINGS: CT HEAD FINDINGS Brain: Generalized atrophy. Background pattern of chronic small vessel ischemic changes throughout the white matter. Old infarction in the right middle cerebral artery territory with cortical and subcortical encephalomalacia. No identifiable acute infarction, mass lesion, hemorrhage, hydrocephalus or extra-axial collection. Vascular: There is atherosclerotic calcification of the major vessels at the base of the brain. Skull: Normal Other: None CT MAXILLOFACIAL FINDINGS Osseous: No facial fracture or acute bone lesion. The patient does have some dental decay. Orbits: Normal Sinuses: Clear.  No acute or chronic inflammatory changes. Soft tissues: No soft tissue lesion of the face identified. IMPRESSION: Head CT: No acute finding. Atrophy and chronic small-vessel ischemic change. 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 Maxillofacial CT: No acute or traumatic finding. No evidence of sinusitis. No cause of facial pain identified. The patient does have some dental decay. Electronically Signed    By: Nelson Chimes M.D.   On: 02/06/2018 13:40    Procedures Procedures (including critical care time)  Medications Ordered in ED Medications  sodium chloride 0.9 % bolus 500 mL (500 mLs Intravenous New Bag/Given 02/06/18 1202)     Initial Impression / Assessment and Plan / ED Course  I have reviewed the triage vital signs and the nursing notes.  Pertinent labs & imaging results that were available during my care of the patient were reviewed by me and considered in my medical decision making (see chart for details).     Pt has thrush in his mouth,  Ct scan shows no evidence of thrush.  Pt has  elevated BUN and creatinine.  Pt given Iv fluids and diflucan. I spoke with Dr. Hetty Ely  Internal medicine who will see for admission  Final Clinical Impressions(s) / ED Diagnoses   Final diagnoses:  Dehydration  Weakness  [facility]    ED Discharge Orders    None       Sidney Ace 02/06/18 1632    Blanchie Dessert, MD 02/06/18 2036

## 2018-02-06 NOTE — H&P (Signed)
Date: 02/06/2018               Patient Name:  [name] Brennan MRN: [id number]  DOB: [birth year] Age / Sex: 77 y.o., male   PCP: Christain Sacramento, MD         Medical Service: Internal Medicine Teaching Service         Attending Physician: Dr. Evette Doffing, Mallie Mussel, *    First Contact: Dr. Sherry Ruffing Pager: [phone]  Second Contact: Dr. Hetty Ely Pager: [phone]       After Hours (After 5p/  First Contact Pager: [phone]  weekends / holidays): Second Contact Pager: [phone]   Chief Complaint: Weakness  History of Present Illness: This is a 77 year old male with a history of HTN, CAD (s/p stent in 2005)and HLD presenting with generalized weakness, weight loss of up to 28 lbs over the last few weeks, and decreased appetite. His wife and son were present for the exam and provided most of the information. Reports some falls where he states he just becomes weak in his legs and can't stand up. The wife states that the Sunday before admission he was very confused and did not recognize his son, he would not do anything and just seemed generally weak. She states that he had not had this before. He has been having these episodes of sinus infections and has been prescribed amoxicillin multiple times over the past month, he had developed a whitish film in his mouth and some facial swelling. The patient reports that he has not been eating as much because everything tastes bad, denies any pain with swallowing or throat pain. They do report a low grade fever (reported 96.3), some chills, and shaking. Denies any nausea, vomiting, chest pain, diarrhea or constipation.   In the ED he was adebrile, tachypneic RR up to 26, HR 100s, BP 140/79. CMP showed a BUN of 60, Cr 1.51 (BUN elevated from baseline, Cr not elevated), Calcium 11. CBC showed a WBC of 14.7, normal Hgb and PLT. CT head showed atrophy and chronic small vessel ischemic changes, with old right MCA territory infarction. Maxillofacial CT showed no acute or traumatic  findings, no evidence of sinusitis, no cause of facial pain found, some dental decay. CXR showed no pleural effusion or atelectasis noted. Patient was given diflucan and 1.2LNS.  Patient was admitted to internal medicine.   Meds:  Scheduled Meds: . [START ON 02/07/2018] feeding supplement (ENSURE ENLIVE)  237 mL Oral BID BM   Continuous Infusions: PRN Meds:.  Current Meds  Medication Sig  . amLODipine (NORVASC) 5 MG tablet TAKE 1 TABLET BY MOUTH TWICE A DAY  . aspirin 81 MG tablet Take 81 mg by mouth daily.    . fenofibrate 160 MG tablet TAKE 1 TABLET BY MOUTH EVERY DAY  . gabapentin (NEURONTIN) 600 MG tablet Take 600 mg by mouth 3 (three) times daily as needed (nerve pain).   . GuaiFENesin (MUCUS RELIEF ADULT PO) Take by mouth as needed.   Marland Kitchen lisinopril (PRINIVIL,ZESTRIL) 10 MG tablet Take 1 tablet (10 mg total) by mouth 2 (two) times daily.  . metoprolol (LOPRESSOR) 50 MG tablet Take 1 tablet (50 mg total) by mouth daily.  . rosuvastatin (CRESTOR) 10 MG tablet Take 10 mg by mouth daily.    . sennosides-docusate sodium (SENOKOT-S) 8.6-50 MG tablet Take 1 tablet by mouth 2 (two) times daily.  Marland Kitchen triamcinolone (KENALOG) 0.1 % cream Apply 1 application topically daily as needed (rash).  Allergies: Allergies as of 02/06/2018 - Review Complete 02/06/2018  Allergen Reaction Noted  . Amoxicillin Swelling 02/06/2018  . Atorvastatin Swelling 04/06/2016  . Avalide [irbesartan-hydrochlorothiazide]  03/13/2011  . Cymbalta [duloxetine hcl]  03/13/2011  . Escitalopram oxalate  03/13/2011  . Fenofibrate  03/13/2011   Past Medical History:  Diagnosis Date  . Allergy   . Coronary disease    with remote inferior myocardial infarction. Documented occlusion of the right  coronary  . CRI (chronic renal insufficiency)   . Dyslipidemia    Combined with hx of marked hypertriglyceridemia  . Hearing loss   . Hemorrhoids 1979  . Hypertension   . Inguinal hernia September 2012   Bilateral   .  Myocardial infarction, old    Remote inferior MI with known occlusion of the RCA.   . Osteoarthritis   . Psoriasis   . Skin cancer     Family History: Prostate and skin cancer in brother, heart attack in father.  Social History: Was a Building control surveyor for 40 years, has always lived in [place], lives with wife, uses walker at home. Denies any EtOH, smoking, or drug use  Review of Systems: A complete ROS was negative except as per HPI.   Physical Exam: Blood pressure (!) 140/97, pulse 100, temperature 97.6 F (36.4 C), temperature source Oral, resp. rate (!) 26, SpO2 97 %. General: alert, appears stated age and no distress HEENT: PERRLA and extra ocular movement intact Heart: S1, S2 normal, no murmur, rub or gallop, regular rate and rhythm Lungs: clear to auscultation, no wheezes or rales and unlabored breathing Abdomen: abdomen is soft without significant tenderness, masses, organomegaly or guarding Musculoskeletal: no joint tenderness, deformity or swelling Skin:no rashes, no wounds Neurology: generalized 3/5 weakness in lower extremities,  Psychiatry: Normal mood and affect   EKG: personally reviewed my interpretation is sinus tachycardia, rate of 101 bmp, q waves in leads 3 and aVF  CXR: personally reviewed my interpretation is midline trachea, no cardiomegaly, clear diaphragmatic angles, no edema, infiltrates or atelectasis  Assessment & Plan by Problem: This is a 77 year old male with a history of HTN, CAD (s/p stent in 2005)and HLD presenting with generalized weakness, weight loss of up to 28 lbs over the last few weeks, and decreased appetite.  Weakness, decreased oral intake: Patient has been reporting generalized weakness for the past few weeks, with some weight loss, decreased appetite, and some confusion. He has been having sinus infections over the past month and has been put on amoxicillin a few times for this. He started having a whitish film in his mouth, reports that he has  not been eating much because everything tastes bad. He does have some signs of infection, including chills, subjective fever, and elevated WBC which could be contributing to the weakness. He denied any signs of a specific source of infection. Denied any dysuria, difficulty urinating, straining, reports that he feels like he empties his bladder. Denied any coughing, shortness of breath, chest pain, and his CXR was NL. Given his history and oral findings there is likely thrush that can cause the decrease in appetite and weight loss.  -BMP and CBC in AM -U/A -PT/OT evaluation -Orthostatic vital signs -HIV -RPR -B12 -Lipid panel  -A1c  HTN HLD -Continue metoprolol  -Continue ASA   CAD (s/p stent in 2005) -Continue ASA  FEN: no fluids, replete lytes prn, regular diet VTE ppx: Lovenox  Code Status: FULL    Dispo: Admit patient to Observation with expected length  of stay less than 2 midnights.  Signed: Asencion Noble, MD 02/06/2018, 6:22 PM  Pager: [phone]

## 2018-02-06 NOTE — ED Notes (Signed)
Admitting MD in to assess pt,

## 2018-02-06 NOTE — Discharge Summary (Signed)
Name: [name] Brennan MRN: [id number] DOB: [birth year] 77 y.o. PCP: Christain [place], MD  Date of Admission: 02/06/2018  9:59 AM Date of Discharge:  02/07/2018  6:29 PM Attending Physician: Axel Filler, *  Discharge Diagnosis: 1. Weakness 2. Thrush  Discharge Medications: Allergies as of 02/07/2018      Reactions   Amoxicillin Swelling   Atorvastatin Swelling   Avalide [irbesartan-hydrochlorothiazide]    Apparently Caused an increase in Creatinine   Cymbalta [duloxetine Hcl]    Anaphylactic     Escitalopram Oxalate    Hives   Fenofibrate    Intolerant       Medication List    STOP taking these medications   amoxicillin 500 MG capsule Commonly known as:  AMOXIL     TAKE these medications   amLODipine 5 MG tablet Commonly known as:  NORVASC TAKE 1 TABLET BY MOUTH TWICE A DAY   aspirin 81 MG tablet Take 81 mg by mouth daily.   fenofibrate 160 MG tablet TAKE 1 TABLET BY MOUTH EVERY DAY   gabapentin 600 MG tablet Commonly known as:  NEURONTIN Take 600 mg by mouth 3 (three) times daily as needed (nerve pain).   lisinopril 10 MG tablet Commonly known as:  PRINIVIL,ZESTRIL Take 1 tablet (10 mg total) by mouth 2 (two) times daily.   metoprolol tartrate 50 MG tablet Commonly known as:  LOPRESSOR Take 1 tablet (50 mg total) by mouth daily.   MUCUS RELIEF ADULT PO Take by mouth as needed.   nystatin 100000 UNIT/ML suspension Commonly known as:  MYCOSTATIN Take 5 mLs (500,000 Units total) by mouth 4 (four) times daily.   rosuvastatin 10 MG tablet Commonly known as:  CRESTOR Take 2 tablets (20 mg total) by mouth daily. What changed:  how much to take   sennosides-docusate sodium 8.6-50 MG tablet Commonly known as:  SENOKOT-S Take 1 tablet by mouth 2 (two) times daily.   triamcinolone cream 0.1 % Commonly known as:  KENALOG Apply 1 application topically daily as needed (rash).       Disposition and follow-up:   [name] Brennan was discharged from  [facility] in Good condition.  At the hospital follow up visit please address:  1.  He reported that he did not take his rosuvastatin when he came into the hospital.  Please make sure that he is taking this and his aspirin 81mg .   He also presented with thrush so please make sure that this has resolved.   2.  Labs / imaging needed at time of follow-up: None  3.  Pending labs/ test needing follow-up: None  Follow-up Appointments: Follow-up Information    Asencion Noble, MD. Go to.   Specialty:  Internal Medicine Why:  1:15 on Monday Sept 26th Contact information: [address] [address] [place] Follow up.   Why:  they will contact you for the first visit. Contact information: [address] [place] [address] [place] Hospital Course by problem list: 1. Weakness: Patient has a history of HTN, CAD (s/p stent in 2005), CVD, and HLD and presented with a few weeks of generalized weakness, weight loss, decreased appetite and some intermittent confusion. He has also been having sinus infections which he has been given courses of amoxicillin for, he reports that he started swelling from that and  discontinued it. He has been having a decline in his memory over the past few months, forgettin. On exam there was generalized weakness with no focal deficits. no significant findings on CBC or CMP. Maxillofacial CT showed no acute or traumatic findings, no evidence of sinusitis, no cause of facial pain found, some dental decay. 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 A1c, B12, and TSH were normal. Lipid panel was normal. HIV and RPR were non-reactive. PT/OT evaluated and they recommended home health PT/OT. This was attributed to vascular dementia given his step wise decline in mental status and intermittent confusion. He was resumed on his  home doses of aspirin and rosuvastatin. Will follow up outpatient.   2. Thrush: Presented with a whitish film on his mouth. Checked his HIV which was negative. He was given diflucan while in the hospital. He reports a decrease in appetitee due to a change in taste which was likely from the [place]. He also has been having some weight loss which could be due to his decreased appetite. Discharged with nystatin.     Discharge Vitals:   BP 120/73   Pulse 83   Temp 98.1 F (36.7 C)   Resp 18   SpO2 98%   Pertinent Labs, Studies, and Procedures:  CBC Latest Ref Rng & Units 02/07/2018 02/06/2018 03/04/2011  WBC 4.0 - 10.5 K/uL 11.2(H) 14.7(H) 6.4  Hemoglobin 13.0 - 17.0 g/dL 14.7 15.0 14.1  Hematocrit 39.0 - 52.0 % 46.0 47.8 43.0  Platelets 150 - 400 K/uL 169 297 203   BMP Latest Ref Rng & Units 02/07/2018 02/06/2018 07/07/2011  Glucose 70 - 99 mg/dL 104(H) 135(H) 151(H)  BUN 8 - 23 mg/dL 36(H) 60(H) 30(H)  Creatinine 0.61 - 1.24 mg/dL 1.06 1.51(H) 1.5  Sodium 135 - 145 mmol/L 138 138 139  Potassium 3.5 - 5.1 mmol/L 3.9 4.9 3.6  Chloride 98 - 111 mmol/L 110 106 108  CO2 22 - 32 mmol/L 19(L) 22 23  Calcium 8.9 - 10.3 mg/dL 10.1 11.0(H) 9.2    Discharge Instructions: Discharge Instructions    Call MD for:  difficulty breathing, headache or visual disturbances   Complete by:  As directed    Call MD for:  extreme fatigue   Complete by:  As directed    Call MD for:  hives   Complete by:  As directed    Call MD for:  persistant dizziness or light-headedness   Complete by:  As directed    Call MD for:  persistant nausea and vomiting   Complete by:  As directed    Call MD for:  redness, tenderness, or signs of infection (pain, swelling, redness, odor or green/yellow discharge around incision site)   Complete by:  As directed    Call MD for:  severe uncontrolled pain   Complete by:  As directed    Call MD for:  temperature >100.4   Complete by:  As directed    Diet - low sodium heart  healthy   Complete by:  As directed    Increase activity slowly   Complete by:  As directed       Signed: Asencion Noble, MD 02/07/2018, 4:53 PM   Pager: [phone]

## 2018-02-07 DIAGNOSIS — I2510 Atherosclerotic heart disease of native coronary artery without angina pectoris: Secondary | ICD-10-CM

## 2018-02-07 DIAGNOSIS — Z8673 Personal history of transient ischemic attack (TIA), and cerebral infarction without residual deficits: Secondary | ICD-10-CM

## 2018-02-07 DIAGNOSIS — R531 Weakness: Secondary | ICD-10-CM | POA: Diagnosis not present

## 2018-02-07 DIAGNOSIS — I10 Essential (primary) hypertension: Secondary | ICD-10-CM

## 2018-02-07 DIAGNOSIS — Z955 Presence of coronary angioplasty implant and graft: Secondary | ICD-10-CM

## 2018-02-07 DIAGNOSIS — R410 Disorientation, unspecified: Secondary | ICD-10-CM

## 2018-02-07 DIAGNOSIS — Z79899 Other long term (current) drug therapy: Secondary | ICD-10-CM

## 2018-02-07 DIAGNOSIS — E785 Hyperlipidemia, unspecified: Secondary | ICD-10-CM

## 2018-02-07 DIAGNOSIS — Z888 Allergy status to other drugs, medicaments and biological substances status: Secondary | ICD-10-CM

## 2018-02-07 DIAGNOSIS — Z7982 Long term (current) use of aspirin: Secondary | ICD-10-CM

## 2018-02-07 DIAGNOSIS — B370 Candidal stomatitis: Secondary | ICD-10-CM | POA: Diagnosis not present

## 2018-02-07 DIAGNOSIS — Z881 Allergy status to other antibiotic agents status: Secondary | ICD-10-CM

## 2018-02-07 DIAGNOSIS — Z9181 History of falling: Secondary | ICD-10-CM

## 2018-02-07 LAB — LIPID PANEL
CHOL/HDL RATIO: 3.1 ratio
CHOLESTEROL: 89 mg/dL (ref 0–200)
HDL: 29 mg/dL — ABNORMAL LOW (ref >40)
LDL CALC: 37 mg/dL (ref 0–99)
Triglycerides: 117 mg/dL (ref <150)
VLDL: 23 mg/dL (ref 0–40)

## 2018-02-07 LAB — COMPREHENSIVE METABOLIC PANEL
ALK PHOS: 95 U/L (ref 38–126)
ALT: 26 U/L (ref 0–44)
ANION GAP: 9 (ref 5–15)
AST: 31 U/L (ref 15–41)
Albumin: 3.2 g/dL — ABNORMAL LOW (ref 3.5–5.0)
BUN: 36 mg/dL — ABNORMAL HIGH (ref 8–23)
CALCIUM: 10.1 mg/dL (ref 8.9–10.3)
CHLORIDE: 110 mmol/L (ref 98–111)
CO2: 19 mmol/L — ABNORMAL LOW (ref 22–32)
CREATININE: 1.06 mg/dL (ref 0.61–1.24)
Glucose, Bld: 104 mg/dL — ABNORMAL HIGH (ref 70–99)
Potassium: 3.9 mmol/L (ref 3.5–5.1)
Sodium: 138 mmol/L (ref 135–145)
Total Bilirubin: 1.6 mg/dL — ABNORMAL HIGH (ref 0.3–1.2)
Total Protein: 6.9 g/dL (ref 6.5–8.1)

## 2018-02-07 LAB — CBC
HCT: 46 % (ref 39.0–52.0)
Hemoglobin: 14.7 g/dL (ref 13.0–17.0)
MCH: 29.5 pg (ref 26.0–34.0)
MCHC: 32 g/dL (ref 30.0–36.0)
MCV: 92.4 fL (ref 78.0–100.0)
PLATELETS: 169 10*3/uL (ref 150–400)
RBC: 4.98 MIL/uL (ref 4.22–5.81)
RDW: 13.6 % (ref 11.5–15.5)
WBC: 11.2 10*3/uL — AB (ref 4.0–10.5)

## 2018-02-07 LAB — HIV ANTIBODY (ROUTINE TESTING W REFLEX): HIV SCREEN 4TH GENERATION: NONREACTIVE

## 2018-02-07 LAB — RPR: RPR Ser Ql: NONREACTIVE

## 2018-02-07 MED ORDER — NYSTATIN 100000 UNIT/ML MT SUSP: 5.0000 mL | Freq: Four times a day (QID) | OROMUCOSAL | 0 refills | Status: DC

## 2018-02-07 MED ORDER — ROSUVASTATIN CALCIUM 10 MG PO TABS: 20.0000 mg | ORAL_TABLET | Freq: Every day | ORAL | 3 refills | Status: DC

## 2018-02-07 MED ORDER — NYSTATIN 100000 UNIT/ML MT SUSP
5.0000 mL | Freq: Four times a day (QID) | OROMUCOSAL | Status: DC
  Administered 2018-02-07 (×2): 500000 [IU] via ORAL
  Filled 2018-02-07 (×2): qty 5

## 2018-02-07 MED ORDER — ACETAMINOPHEN 325 MG PO TABS
650.0000 mg | ORAL_TABLET | Freq: Four times a day (QID) | ORAL | Status: DC | PRN
  Administered 2018-02-07: 650 mg via ORAL
  Filled 2018-02-07: qty 2

## 2018-02-07 NOTE — Progress Notes (Signed)
Pt being discharged from hospital per orders from MD. Pt educated on discharge instructions. Pt verbalized understanding of instructions. All questions and concerns were addressed. Pt's IV was removed prior to discharge. Pt exited hospital via wheelchair accompanied by staff. 

## 2018-02-07 NOTE — Discharge Instructions (Signed)
[name] Brennan,   It has been a pleasure working with you and we are glad you're feeling better. You were hospitalized for thrush (a yeast infection on your tongue), and a small stroke.   For your thrush,  START taking Nystatin suspension, use 4x a day for 3-4 days  For your stroke Continue taking your aspirin daily Continue taking your rosuvastatin 10 mg daily Please make sure to exercise and eat a healthy and balanced diet Please follow up with your primary care provider in 1-2 weeks  If your symptoms worsen or you develop new symptoms, please seek medical help whether it is your primary care provider or emergency department.  If you have any questions about this hospitalization please call [phone].

## 2018-02-07 NOTE — Care Management Note (Signed)
Case Management Note  Patient Details  Name: KANO HECKMANN MRN: [id number] Date of Birth: [birth year]  Subjective/Objective:   Pt in with decreased oral intake and falls. He is from home with spouse.  PCP: Dr Patricia Nettle; [facility] medicare                 Action/Plan: Awaiting PT/OT evals. CM following for d/c needs, physician orders.   Expected Discharge Date:                  Expected Discharge Plan:     In-House Referral:     Discharge planning Services     Post Acute Care Choice:    Choice offered to:     DME Arranged:    DME Agency:     HH Arranged:    HH Agency:     Status of Service:  In process, will continue to follow  If discussed at Long Length of Stay Meetings, dates discussed:    Additional Comments:  Pollie Friar, RN 02/07/2018, 11:24 AM

## 2018-02-07 NOTE — Evaluation (Signed)
Occupational Therapy Evaluation Patient Details Name: [name] Brennan MRN: [id number] DOB: 1941-05-04 Today's Date: 02/07/2018    History of Present Illness 77 year old male with a history of HTN, CAD (s/p stent in 2005)and HLD presenting with generalized weakness, weight loss of up to 28 lbs over the last few weeks, and decreased appetite   Clinical Impression   PT admitted with generalized weakness . Pt currently with functional limitiations due to the deficits listed below (see OT problem list). PTA pt requires (A) from wife for adls and completed bathing via sponge bathing. Pt currently requires min (A) for basic transfers and incr time to complete supine to sit from bed. Pt requires use of bedrail to elevate from bed surface. Pt and wife could benefit from home health services to help maximize independence. Patient requesting a scooter to use in the home to help with mobility because he does not feel that a rollator is safe.  Pt will benefit from skilled OT to increase their independence and safety with adls and balance to allow discharge [place].     Follow Up Recommendations  Home health OT    Equipment Recommendations  Other (comment)(RW)    Recommendations for Other Services       Precautions / Restrictions Precautions Precautions: Fall Restrictions Weight Bearing Restrictions: No      Mobility Bed Mobility Overal bed mobility: Needs Assistance Bed Mobility: Supine to Sit;Sit to Supine     Supine to sit: Supervision Sit to supine: Supervision   General bed mobility comments: using bed rail to pull up  Transfers Overall transfer level: Needs assistance Equipment used: Rolling walker (2 wheeled) Transfers: Sit to/from Stand Sit to Stand: Min guard              Balance Overall balance assessment: History of Falls;Needs assistance Sitting-balance support: Feet supported Sitting balance-Leahy Scale: Good     Standing balance support: Bilateral upper extremity  supported;During functional activity Standing balance-Leahy Scale: Fair                             ADL either performed or assessed with clinical judgement   ADL Overall ADL's : Needs assistance/impaired Eating/Feeding: Set up   Grooming: Set up   Upper Body Bathing: Set up   Lower Body Bathing: Moderate assistance           Toilet Transfer: [employer] fast streamer      Pertinent Vitals/Pain Pain Assessment: No/denies pain     Hand Dominance Right   Extremity/Trunk Assessment Upper Extremity Assessment Upper Extremity Assessment: Generalized weakness   Lower Extremity Assessment Lower Extremity Assessment: Generalized weakness   Cervical / Trunk Assessment Cervical / Trunk Assessment: Kyphotic   Communication Communication Communication: HOH   Cognition Arousal/Alertness: Awake/alert Behavior During Therapy: Impulsive Overall Cognitive Status: Impaired/Different from baseline Area of Impairment: Attention;Memory;Safety/judgement;Awareness;Problem solving                   Current Attention Level: Sustained Memory: Decreased short-term memory   Safety/Judgement: Decreased awareness of safety;Decreased awareness of deficits Awareness: Emergent Problem Solving: Slow processing;Decreased initiation;Difficulty sequencing;Requires verbal cues;Requires tactile cues General Comments: pt perseverating on topics not realate. pt tearful at times during session talking about how he doesnt undersatnd how anyone could  harm a child.    General Comments       Exercises     Shoulder Instructions      Home Living Family/patient expects to be discharged to:: Private residence Living Arrangements: Spouse/significant other Available Help at Discharge: Family Type of Home: House Home Access: Stairs to enter Technical brewer of Steps: 4 Entrance Stairs-Rails: Can reach both Home Layout:  One level     Bathroom Shower/Tub: (bird bathes)   Bathroom Toilet: Standard     Home Equipment: Environmental consultant - 4 wheels          Prior Functioning/Environment Level of Independence: Needs assistance    ADL's / Homemaking Assistance Needed: wife (A) with sponge bath at baseline            OT Problem List: Decreased strength;Decreased range of motion;Decreased activity tolerance;Impaired balance (sitting and/or standing);Decreased cognition;Decreased safety awareness;Decreased knowledge of use of DME or AE;Decreased knowledge of precautions      OT Treatment/Interventions: Self-care/ADL training;Therapeutic exercise;Energy conservation;DME and/or AE instruction;Therapeutic activities;Cognitive remediation/compensation;Balance training;Patient/family education    OT Goals(Current goals can be found in the care plan section) Acute Rehab OT Goals Patient Stated Goal: to go home OT Goal Formulation: Patient unable to participate in goal setting Time For Goal Achievement: 02/21/18 Potential to Achieve Goals: Good  OT Frequency: Min 2X/week   Barriers to D/C:            Co-evaluation              AM-PAC PT "6 Clicks" Daily Activity     Outcome Measure Help from another person eating meals?: A Little Help from another person taking care of personal grooming?: A Little Help from another person toileting, which includes using toliet, bedpan, or urinal?: A Lot Help from another person bathing (including washing, rinsing, drying)?: A Little Help from another person to put on and taking off regular upper body clothing?: A Little Help from another person to put on and taking off regular lower body clothing?: A Lot 6 Click Score: 16   End of Session Nurse Communication: Mobility status;Precautions  Activity Tolerance: Patient tolerated treatment well Patient left: in bed;with call bell/phone within reach;Other (comment)(RN at bedside giving medication)  OT Visit Diagnosis:  Unsteadiness on feet (R26.81);Muscle weakness (generalized) (M62.81)                Time: 6283-1517 OT Time Calculation (min): 27 min Charges:  OT General Charges $OT Visit: 1 Visit OT Evaluation $OT Eval Moderate Complexity: 1 Mod   Jeri Modena   OTR/L Pager: [phone] Office: [phone] .   Parke Poisson B 02/07/2018, 3:27 PM

## 2018-02-07 NOTE — Care Management Obs Status (Signed)
[facility] NOTIFICATION   Patient Details  Name: AARYAN ESSMAN MRN: [id number] Date of Birth: [birth year]   Medicare Observation Status Notification Given:  Yes    Pollie Friar, RN 02/07/2018, 4:11 PM

## 2018-02-07 NOTE — Progress Notes (Signed)
   Subjective: Patient was doing well today, wife was at bedside. She reports that he is acting more normal then when he came in. He denies any pain at the moment. We discussed the results of the CT scan with the chronic small vessel changes and discussed with them the need to continue the aspirin and cholesterol medications. We also discussed with them the need for PT to evaluate and assess what type of things he could use at home. They are in agreement with the plan.  Objective:  Vital signs in last 24 hours: Vitals:   02/06/18 2000 02/06/18 2108 02/06/18 2222 02/07/18 0314  BP: 124/85 127/72 114/73 115/74  Pulse: (!) 101 89 65 67  Resp: 20 18 18 18   Temp:  98.7 F (37.1 C) 97.7 F (36.5 C) 98.1 F (36.7 C)  TempSrc:  Oral Oral   SpO2: 97% 97% 98% 98%    General: Well appearing male, NAD Cardiac: RRR, normal S1, S2, no murmurs, rubs or gallops Pulmonary: Lungs CTA bilaterally, no wheezing, rhonchi or rales  Extremity: No LE edema, no muscle atrophy Neuro: Alert, PERRLA, moves all extremities, 4/5 strength throughout Psychiatry: Normal mood and affect   Assessment/Plan: This is a 77 year old male with a history of HTN, CAD (s/p stent in 2005)and HLD presenting with generalized weakness, weight loss of up to 28 lbs over the last few weeks, and decreased appetite.   Weakness, confusion, decreased appetite: Patient has been having more and more confusion and seems to have a fluctuating course. His head CT showed atrophy and small vessel ischemic changes with the old right MCA territory infarction. No localizing findings on exam suggestive of an acute stroke just some generalized weakness. Given the findings and the stepwise decline in function there is a concern for a vascular dementia. There was also a possibility that the generalized weakness is from dehydration 2/2 decreased oral intake with the thrush, he received 1.5L NS. -Orthostatic vitals  -His A1c was 5.8, TSH was 0.493. Lipid  panel showed cholesterol 89, HDL 29, and LDL 37. -HIV pending -RPR -B12 -PT/OT evaluation  Thrush: Patient reports that the mouth issues are improving, he did eat some sherbert this morning which the wife states is an improvement. -S/p diflucan 200mg  -F/u HIV -Diflucan 400 mg daily -Swish and swallow  HTN/ HLD: Patient has been normotensive on admission.  -Continue metoprolol  -Continue amlodipine -Continue ASA   CAD (s/p stent in 2005) -Continue ASA  FEN: No fluids, s/p 1.5L NS, replete lytes prn, regular diet VTE ppx: Lovenox  Code Status: FULL   Dispo: Anticipated discharge in approximately today pending OT/PT eval.  Asencion Noble, MD 02/07/2018, 6:14 AM Pager: [phone]

## 2018-02-07 NOTE — Evaluation (Signed)
Physical Therapy Evaluation Patient Details Name: [name] Brennan MRN: [id number] DOB: 1940-09-10 Today's Date: 02/07/2018   History of Present Illness  77 year old male with a history of HTN, CAD (s/p stent in 2005)and HLD presenting with generalized weakness, weight loss of up to 28 lbs over the last few weeks, and decreased appetite  Clinical Impression  Orders received for PT evaluation. Patient demonstrates deficits in functional mobility as indicated below. Will benefit from continued skilled PT to address deficits and maximize function. Will see as indicated and progress as tolerated.  Spoke with patient re: SNF vs HHPT, agreeable to HHPT and family assist.     Follow Up Recommendations Home health PT;Supervision/Assistance - 24 hour    Equipment Recommendations  None recommended by PT    Recommendations for Other Services       Precautions / Restrictions Precautions Precautions: Fall      Mobility  Bed Mobility Overal bed mobility: Needs Assistance Bed Mobility: Supine to Sit;Sit to Supine     Supine to sit: Min assist Sit to supine: Min assist   General bed mobility comments: Min assist to pull to upright and elevate trunk, assist to reposition upon return to supine  Transfers Overall transfer level: Needs assistance Equipment used: Rolling walker (2 wheeled) Transfers: Sit to/from Stand Sit to Stand: Min guard         General transfer comment: Max VCs for hand placement and positioning prior to elevation to standing. some initial posterior list upon elevation but able to self correct. performed x3 during session  Ambulation/Gait Ambulation/Gait assistance: Min guard Gait Distance (Feet): 160 Feet Assistive device: Rolling walker (2 wheeled) Gait Pattern/deviations: Step-through pattern;Decreased stride length;Trunk flexed;Narrow base of support;Drifts right/left Gait velocity: decreased Gait velocity interpretation: <1.8 ft/sec, indicate of risk for  recurrent falls General Gait Details: patient with some instability during ambulation, educated patient on importance of steady cadence. 1 noted LOB but able to self correct without physical assist.   Stairs            Wheelchair Mobility    Modified Rankin (Stroke Patients Only)       Balance Overall balance assessment: History of Falls;Needs assistance Sitting-balance support: Feet supported Sitting balance-Leahy Scale: Good     Standing balance support: Bilateral upper extremity supported;During functional activity Standing balance-Leahy Scale: Fair                               Pertinent Vitals/Pain Pain Assessment: Faces Pain Score: 0-No pain    Home Living Family/patient expects to be discharged to:: Private residence Living Arrangements: Spouse/significant other Available Help at Discharge: Family Type of Home: House Home Access: Stairs to enter Entrance Stairs-Rails: Can reach both Entrance Stairs-Number of Steps: 4 Home Layout: One level Home Equipment: Environmental consultant - 4 wheels      Prior Function Level of Independence: Needs assistance               Hand Dominance   Dominant Hand: Right    Extremity/Trunk Assessment   Upper Extremity Assessment Upper Extremity Assessment: Generalized weakness    Lower Extremity Assessment Lower Extremity Assessment: Generalized weakness(decreased LE coordination bilaterally)    Cervical / Trunk Assessment Cervical / Trunk Assessment: Kyphotic  Communication   Communication: HOH  Cognition Arousal/Alertness: Awake/alert Behavior During Therapy: Impulsive Overall Cognitive Status: Impaired/Different from baseline Area of Impairment: Attention;Memory;Safety/judgement;Awareness;Problem solving  Current Attention Level: Sustained Memory: Decreased short-term memory   Safety/Judgement: Decreased awareness of safety;Decreased awareness of deficits Awareness:  Emergent Problem Solving: Slow processing;Decreased initiation;Difficulty sequencing;Requires verbal cues;Requires tactile cues        General Comments      Exercises     Assessment/Plan    PT Assessment Patient needs continued PT services  PT Problem List Decreased strength;Decreased activity tolerance;Decreased balance;Decreased mobility;Decreased cognition;Decreased safety awareness       PT Treatment Interventions DME instruction;Gait training;Stair training;Functional mobility training;Therapeutic activities;Therapeutic exercise;Balance training;Patient/family education;Neuromuscular re-education;Cognitive remediation    PT Goals (Current goals can be found in the Care Plan section)  Acute Rehab PT Goals Patient Stated Goal: to go home PT Goal Formulation: With patient/family Time For Goal Achievement: 02/21/18 Potential to Achieve Goals: Good    Frequency Min 3X/week   Barriers to discharge        Co-evaluation               AM-PAC PT "6 Clicks" Daily Activity  Outcome Measure Difficulty turning over in bed (including adjusting bedclothes, sheets and blankets)?: A Little Difficulty moving from lying on back to sitting on the side of the bed? : Unable Difficulty sitting down on and standing up from a chair with arms (e.g., wheelchair, bedside commode, etc,.)?: A Little Help needed moving to and from a bed to chair (including a wheelchair)?: A Little Help needed walking in hospital room?: A Little Help needed climbing 3-5 steps with a railing? : A Lot 6 Click Score: 15    End of Session Equipment Utilized During Treatment: Gait belt Activity Tolerance: Patient tolerated treatment well Patient left: in bed;with call bell/phone within reach;with bed alarm set;with family/visitor present Nurse Communication: Mobility status PT Visit Diagnosis: Unsteadiness on feet (R26.81);History of falling (Z91.81)    Time: 1117-3567 PT Time Calculation (min) (ACUTE  ONLY): 21 min   Charges:   PT Evaluation $PT Eval Moderate Complexity: 1 Mod          Alben Deeds, PT DPT  Board Certified Neurologic Specialist [place] 02/07/2018, 12:19 PM

## 2018-02-07 NOTE — Care Management Note (Signed)
Case Management Note  Patient Details  Name: [name] Brennan MRN: [id number] Date of Birth: [birth year]  Subjective/Objective:                    Action/Plan: Pt discharging home with orders for [facility] services. CM provided choice and they selected [facility]. Butch Penny with [facility] notified and accepted the referral.  Pt address: [address] in [place]. Pt has a walker at home.  Family to provide transport home.   Expected Discharge Date:  02/07/18               Expected Discharge Plan:  [place]  In-House Referral:     Discharge planning Services  CM Consult  Post Acute Care Choice:  Home Health Choice offered to:  Patient, Spouse  DME Arranged:    DME Agency:     HH Arranged:  PT, OT HH Agency:  [facility]  Status of Service:  Completed, signed off  If discussed at [place] of Stay Meetings, dates discussed:    Additional Comments:  Pollie Friar, RN 02/07/2018, 4:13 PM

## 2018-02-13 ENCOUNTER — Ambulatory Visit (INDEPENDENT_AMBULATORY_CARE_PROVIDER_SITE_OTHER): Payer: Medicare Other | Admitting: Internal Medicine

## 2018-02-13 ENCOUNTER — Encounter: Payer: Self-pay | Admitting: Internal Medicine

## 2018-02-13 ENCOUNTER — Other Ambulatory Visit: Payer: Self-pay

## 2018-02-13 VITALS — BP 98/74 | HR 60 | Temp 97.4°F | Ht 71.0 in

## 2018-02-13 DIAGNOSIS — I10 Essential (primary) hypertension: Secondary | ICD-10-CM

## 2018-02-13 DIAGNOSIS — I2510 Atherosclerotic heart disease of native coronary artery without angina pectoris: Secondary | ICD-10-CM | POA: Diagnosis not present

## 2018-02-13 DIAGNOSIS — E785 Hyperlipidemia, unspecified: Secondary | ICD-10-CM

## 2018-02-13 DIAGNOSIS — F0150 Vascular dementia without behavioral disturbance: Secondary | ICD-10-CM | POA: Diagnosis not present

## 2018-02-13 DIAGNOSIS — B370 Candidal stomatitis: Secondary | ICD-10-CM | POA: Diagnosis not present

## 2018-02-13 DIAGNOSIS — Z79899 Other long term (current) drug therapy: Secondary | ICD-10-CM

## 2018-02-13 MED ORDER — SERTRALINE HCL 25 MG PO TABS: 25.0000 mg | ORAL_TABLET | Freq: Every day | ORAL | 0 refills | Status: DC

## 2018-02-13 MED ORDER — AMLODIPINE BESYLATE 5 MG PO TABS: 5.0000 mg | ORAL_TABLET | Freq: Every day | ORAL | 0 refills | Status: DC

## 2018-02-13 MED ORDER — NYSTATIN 100000 UNIT/ML MT SUSP: 5.0000 mL | Freq: Four times a day (QID) | OROMUCOSAL | 0 refills | Status: DC

## 2018-02-13 NOTE — Assessment & Plan Note (Signed)
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  His A1c, vitamin B12, TSH, lipid panel, HIV, RPR were all normal.  The patient was recommended to continue aspirin 81 mg, amlodipine, and metoprolol for blood pressure control. The patient was set up with home PT/OT.  At his follow up visit the patient's family mention that they feel that the patient has not been himself since April 2019 and possibly many months before. They note that he has not been eating, has had decreased sleep, he has been weak, he has difficulty standing up and moving around occasionally.  Patient's family also notes that at times he gets agitated which is different from his usual personality.  The wife also notes that he is been watching less amount of violent TV shows which he previously used to love watching.  Assessment and plan  The patient has a rapid progressive decline in his neurological status.  It is common for family members and the patient to underestimate the timeline during which the cognitive decline occurred.  However, will refer the patient to neurology for further work-up.  She does not have tremors, rigidity and therefore unlikely to have parkinsonian dementia.  He does not have hallucinations.  The patient's gait disturbance and and pseudobulbar affect with signs and symptoms of vascular dementia.  His PHQ 9 is 15 during today's visit.    -We will treat the patient's mood symptoms with sertraline 25 mg daily -Referred the patient to neurology for further evaluation. -The patient may benefit from a acetylcholine esterase inhibitors such as donepezil or Rivastigmine.  There has been studies that show moderate benefit in acetylcholinesterase inhibitor use in vascular dementia. -Recommend the patient continue home PT/OT -The patient would like to establish care with [facility] clinic, therefore will request  records from the patient's PCP

## 2018-02-13 NOTE — Assessment & Plan Note (Signed)
The patient's blood pressure during this visit was 98/74, 60.  Patient is currently taking amlodipine 5 mg twice daily, metoprolol 50 mg daily, lisinopril 10 mg twice daily.    Assessment and plan The patient's blood pressure is too tightly controlled.  Due to the patient's concrement diagnosis of vascular dementia there is a risk of the patient developing further neurological symptoms that may cause a traumatic fall with a low blood pressure.     -Recommend the patient decrease amlodipine from 5 mg twice daily to 5 mg daily.

## 2018-02-13 NOTE — Progress Notes (Signed)
   CC: Progressive weakness and decreased functional status  HPI:  [name] is a 77 y.o. with hypertension, coronary artery disease, dyslipidemia presents for hospital follow-up regarding progressive weakness, decreased functional status, thrush. Please see problem based charting for evaluation, assessment, and plan.  Past Medical History:  Diagnosis Date  . Allergy   . Coronary disease    with remote inferior myocardial infarction. Documented occlusion of the right  coronary  . CRI (chronic renal insufficiency)   . Dyslipidemia    Combined with hx of marked hypertriglyceridemia  . Hearing loss   . Hemorrhoids 1979  . Hypertension   . Inguinal hernia September 2012   Bilateral   . Myocardial infarction, old    Remote inferior MI with known occlusion of the RCA.   . Osteoarthritis   . Psoriasis   . Skin cancer    Review of Systems:    Decreased appetite, poor taste and odor sensation, weakness, fatigue No urinary or fecal incontinence, numbness/tingling   Physical Exam:  Vitals:   02/13/18 1326  BP: 98/74  Pulse: 60  Temp: (!) 97.4 F (36.3 C)  TempSrc: Oral  SpO2: 100%  Height: 5\' 11"  (1.803 m)   Physical Exam  Constitutional: He appears well-developed and well-nourished. No distress.  HENT:  Head: Normocephalic and atraumatic.  Eyes: Conjunctivae are normal.  Cardiovascular: Normal rate and regular rhythm.  Respiratory: Effort normal and breath sounds normal. No respiratory distress. He has no wheezes.  GI: Soft. Bowel sounds are normal. He exhibits no distension. There is no tenderness.  Musculoskeletal: He exhibits no edema.  Difficulty sitting up out of chair.  5/5 muscle strength in right lower extremity, 4/5 muscle strength in the left lower extremity.  No sensory deficits in lower extremities. 5/5 muscle strength in bilateral upper extremities.  Neurological: He is alert.  Skin: He is not diaphoretic. No erythema.  Psychiatric: He has a normal mood  and affect. His behavior is normal. Judgment and thought content normal.    Assessment & Plan:   See Encounters Tab for problem based charting.  Patient discussed with Dr. Rebeca Alert

## 2018-02-13 NOTE — Patient Instructions (Addendum)
It was a pleasure to see you today [name] Brennan. Please make the following changes:  -Start taking sertraline 25 mg daily -Please continue working with your physical therapist -Please follow-up with neurology -Please decrease her amlodipine dosage from 10 mg daily to 5 mg daily -Please follow-up in 2 weeks -I have refilled your Nystatin for thrush  If you have any questions or concerns, please call our clinic at [phone] between 9am-5pm and after hours call [phone] and ask for the internal medicine resident on call. If you feel you are having a medical emergency please call 911.   Thank you, we look forward to help you remain healthy!  Lars Mage, MD Internal Medicine PGY2   Vascular Dementia Dementia is a condition in which a person has problems with thinking, memory, and behavior that are severe enough to interfere with daily life. Vascular dementia is a type of dementia. It results from brain damage that is caused by the brain not getting enough blood. Vascular dementia usually begins between 28 and 50 years of age. What are the causes? Vascular dementia is caused by conditions that lessen blood flow to the brain. Common causes include:  Multiple small strokes. These may happen without symptoms (silent stroke).  Major stroke.  Damage to small blood vessels in the brain (cerebral small vessel disease).  What increases the risk?  Advancing age.  Having had a stroke.  Having high blood pressure (hypertension) or high cholesterol.  Having a disease that affects the heart or blood vessels.  Smoking.  Having diabetes.  Being male.  Being obese.  Not being active.  Having depression. What are the signs or symptoms? Symptoms can vary a lot from one person to another. Symptoms may be mild or severe depending on the amount of damage and which parts of the brain have been affected. Symptoms may begin suddenly or may develop gradually. Symptoms may remain stable, or  they may get worse over time. Symptoms of vascular dementia may be similar to those of Alzheimer disease. The two conditions can occur together (mixed dementia). Symptoms of vascular dementia may include: Mental  Confusion.  Memory problems.  Poor attention and concentration.  Trouble understanding speech.  Depression.  Personality changes.  Trouble recognizing familiar people.  Agitation or aggression.  Paranoia.  Delusions or hallucinations. Physical  Weakness.  Poor balance.  Loss of bladder or bowel control (incontinence).  Unsteady walking (gait).  Speaking problems. Behavioral  Getting lost in familiar places.  Problems with planning and judgment.  Trouble following instructions.  Social problems.  Emotional outbursts.  Trouble with daily activities and self-care.  Problems handling money. How is this diagnosed? There is not a specific test to diagnose vascular dementia. The health care provider will consider the person's medical history and symptoms or changes that are reported by friends and family. The health care provider will do a physical exam and may order lab tests or other tests that check brain and nervous system function. Tests that may be done include:  Blood tests.  Brain imaging tests.  Tests of movement, speech, and other daily activities (neurological exam).  Tests of memory, thinking, and problem-solving (neuropsychological or neurocognitive testing).  Diagnosis may involve several specialists. These may include a health care provider who specializes in the brain and nervous system (neurologist), a provider who specializes in disorders of the mind (psychiatrist), and a provider who focuses on speech and language changes (Electrical engineer). How is this treated? There is no cure for vascular dementia.  Brain damage that has already occurred cannot be reversed. Treatment depends on:  How severe the condition is.  Which parts of  the brain have been affected.  The person's overall health.  Treatment measures aim to:  Treat the underlying cause of vascular dementia and manage risk factors. This may include: ? Controlling blood pressure. ? Lowering cholesterol. ? Treating diabetes. ? Quitting smoking. ? Losing weight.  Manage symptoms.  Prevent further brain damage.  Improve the person's health and quality of life.  Treatment for dementia may involve a team of health care providers, including:  A neurologist.  A psychiatrist.  An occupational therapist.  A speech pathologist.  A cardiologist.  An exercise physiologist or physical therapist.  Follow these instructions at home: Home care for a person with vascular dementia depends on what caused the condition and how severe the symptoms are. General guidelines for care at home include:  Following the health care provider's instructions for treating the condition that caused the dementia.  Using medicines only as told by the person's health care provider.  Creating a safe living space.  Learning ways to help the person remember people, appointments, and daily activities.  Finding a support group to help caregivers and family to cope with the effects of dementia.  Helping family and friends learn about ways to communicate with a person who has dementia.  Making sure the person keeps all follow-up visits and goes to all rehabilitation appointments as told by the health care team. This is important.  Contact a health care provider if:  A fever develops.  New behavioral problems develop.  Problems with swallowing develop.  Confusion gets worse.  Sleepiness gets worse. Get help right away if:  Loss of consciousness occurs.  There is a sudden loss of speech, balance, or thinking ability.  New numbness or paralysis occurs.  Sudden, severe headache occurs.  Vision is lost or suddenly gets worse in one or both eyes. This information  is not intended to replace advice given to you by your health care provider. Make sure you discuss any questions you have with your health care provider. Document Released: 05/28/2002 Document Revised: 11/13/2015 Document Reviewed: 09/18/2014 Elsevier Interactive Patient Education  2018 [employer].

## 2018-02-13 NOTE — Assessment & Plan Note (Signed)
The patient was noted to have thrush during his recent hospitalization which was treated with nystatin.  He was examined for HIV which was negative.   Assessment and plan   On examination today the patient continues to still have oral thrush that was noted.  The thrush is likely partially contributing to the patient's poor taste.  Will continue nystatin swish and swallow and assess for resolution in 2 weeks.

## 2018-02-21 DIAGNOSIS — Z85828 Personal history of other malignant neoplasm of skin: Secondary | ICD-10-CM | POA: Diagnosis not present

## 2018-02-21 DIAGNOSIS — E785 Hyperlipidemia, unspecified: Secondary | ICD-10-CM | POA: Diagnosis not present

## 2018-02-21 DIAGNOSIS — I10 Essential (primary) hypertension: Secondary | ICD-10-CM | POA: Diagnosis not present

## 2018-02-21 DIAGNOSIS — Z8673 Personal history of transient ischemic attack (TIA), and cerebral infarction without residual deficits: Secondary | ICD-10-CM | POA: Diagnosis not present

## 2018-02-21 DIAGNOSIS — M6281 Muscle weakness (generalized): Secondary | ICD-10-CM | POA: Diagnosis not present

## 2018-02-21 DIAGNOSIS — B370 Candidal stomatitis: Secondary | ICD-10-CM | POA: Diagnosis not present

## 2018-02-21 DIAGNOSIS — I2510 Atherosclerotic heart disease of native coronary artery without angina pectoris: Secondary | ICD-10-CM | POA: Diagnosis not present

## 2018-02-21 DIAGNOSIS — Z7982 Long term (current) use of aspirin: Secondary | ICD-10-CM | POA: Diagnosis not present

## 2018-02-22 ENCOUNTER — Telehealth: Payer: Self-pay | Admitting: Internal Medicine

## 2018-02-22 DIAGNOSIS — I10 Essential (primary) hypertension: Secondary | ICD-10-CM | POA: Diagnosis not present

## 2018-02-22 DIAGNOSIS — I2510 Atherosclerotic heart disease of native coronary artery without angina pectoris: Secondary | ICD-10-CM | POA: Diagnosis not present

## 2018-02-22 DIAGNOSIS — M6281 Muscle weakness (generalized): Secondary | ICD-10-CM | POA: Diagnosis not present

## 2018-02-22 DIAGNOSIS — B370 Candidal stomatitis: Secondary | ICD-10-CM | POA: Diagnosis not present

## 2018-02-22 NOTE — Telephone Encounter (Signed)
Pt's wife would like a call back.  The pt is unable sleep, very restless during the day and nighttime.  Pt has an appt to rtn on 02/27/2018 to see his PCP.

## 2018-02-22 NOTE — Telephone Encounter (Signed)
rtc to pt's wife, she is very concerned stating pt is not able to sleep and he's getting "cranky", she is very worried about him ACC thurs 9/5 at 1315

## 2018-02-23 ENCOUNTER — Encounter: Payer: Self-pay | Admitting: Internal Medicine

## 2018-02-23 ENCOUNTER — Ambulatory Visit (INDEPENDENT_AMBULATORY_CARE_PROVIDER_SITE_OTHER): Payer: Medicare Other | Admitting: Internal Medicine

## 2018-02-23 VITALS — BP 110/66 | HR 58 | Temp 97.6°F | Ht 71.0 in | Wt 155.1 lb

## 2018-02-23 DIAGNOSIS — G479 Sleep disorder, unspecified: Secondary | ICD-10-CM

## 2018-02-23 DIAGNOSIS — I10 Essential (primary) hypertension: Secondary | ICD-10-CM | POA: Diagnosis not present

## 2018-02-23 DIAGNOSIS — Z23 Encounter for immunization: Secondary | ICD-10-CM | POA: Diagnosis not present

## 2018-02-23 DIAGNOSIS — Z79899 Other long term (current) drug therapy: Secondary | ICD-10-CM

## 2018-02-23 DIAGNOSIS — Z85828 Personal history of other malignant neoplasm of skin: Secondary | ICD-10-CM | POA: Diagnosis not present

## 2018-02-23 DIAGNOSIS — M542 Cervicalgia: Secondary | ICD-10-CM | POA: Diagnosis not present

## 2018-02-23 DIAGNOSIS — M6281 Muscle weakness (generalized): Secondary | ICD-10-CM | POA: Diagnosis not present

## 2018-02-23 DIAGNOSIS — I2510 Atherosclerotic heart disease of native coronary artery without angina pectoris: Secondary | ICD-10-CM

## 2018-02-23 DIAGNOSIS — Z8673 Personal history of transient ischemic attack (TIA), and cerebral infarction without residual deficits: Secondary | ICD-10-CM | POA: Diagnosis not present

## 2018-02-23 DIAGNOSIS — R519 Headache, unspecified: Secondary | ICD-10-CM | POA: Insufficient documentation

## 2018-02-23 DIAGNOSIS — Z7982 Long term (current) use of aspirin: Secondary | ICD-10-CM | POA: Diagnosis not present

## 2018-02-23 DIAGNOSIS — E785 Hyperlipidemia, unspecified: Secondary | ICD-10-CM | POA: Diagnosis not present

## 2018-02-23 DIAGNOSIS — B370 Candidal stomatitis: Secondary | ICD-10-CM | POA: Diagnosis not present

## 2018-02-23 DIAGNOSIS — R2689 Other abnormalities of gait and mobility: Secondary | ICD-10-CM | POA: Diagnosis not present

## 2018-02-23 DIAGNOSIS — F0150 Vascular dementia without behavioral disturbance: Secondary | ICD-10-CM | POA: Diagnosis not present

## 2018-02-23 DIAGNOSIS — R51 Headache: Secondary | ICD-10-CM | POA: Diagnosis not present

## 2018-02-23 MED ORDER — MELATONIN 5 MG PO TABS: 5.0000 mg | ORAL_TABLET | Freq: Every evening | ORAL | 0 refills | Status: DC | PRN

## 2018-02-23 MED ORDER — LISINOPRIL 10 MG PO TABS: 10.0000 mg | ORAL_TABLET | Freq: Every day | ORAL | 2 refills | Status: DC

## 2018-02-23 NOTE — Assessment & Plan Note (Addendum)
Family has not started sertraline due to fear of side effects. He is still having sleep disturbances with taking cat naps during the day and being awake at night. Had some worsened gait instability yesterday but likely due to benadryl in tylenol PM; this has since resolved and he has no neurological deficits on exam today.   Plan: --start melatonin 3mg  qhs prn for sleep; advised about sleep/wake cycle reversal in dementia and realistic expectations of this; they are in agreement with avoiding overly sedating meds and centrally acting meds which may put him at higher risk of injury  --advised that it was safe to start low dose sertraline 25mg  daily; explained common GI upset side effects for first few days and that this should improve --advised to avoid benadryl containing products and stop gabapentin --he will continue with [facility] PT for strength --has neurology f/u next week --f/u in 4 wks

## 2018-02-23 NOTE — Progress Notes (Signed)
Internal Medicine Clinic Attending  Case discussed with Dr. Svalina  at the time of the visit.  We reviewed the resident's history and exam and pertinent patient test results.  I agree with the assessment, diagnosis, and plan of care documented in the resident's note.  

## 2018-02-23 NOTE — Assessment & Plan Note (Signed)
Patient with 2 days of right sided suboccipital pain, dull, w/o associated neurological symptoms (had gait instability yesterday but likely from bendryl). Today he has no neurological deficits. Pain likely from suboccipital mm irritation vs occipital neuralgia.   Plan: --advised use of heat or ice, proper posture, good neck support when lying down --can take OTC ibuprofen sparingly if above does not help any

## 2018-02-23 NOTE — Assessment & Plan Note (Signed)
Family had concerns about elevated diastolic BP yesterday; initial reading elevated today as well, however on repeat this normalized.   Since last visit, family decreased lisinopril from 10mg  BID to once a day.  Plan: --continue with lisinopril 10mg  once daily, amlodipine 5mg  daily, and metoprolol 50mg  daily

## 2018-02-23 NOTE — Progress Notes (Signed)
   CC: neck pain  HPI:  Mr.Kolden SADLER TESCHNER is a 77 y.o. with a PMH of vascular dementia, HTN, CAD presenting to clinic for evaluation of neck pain.  Patient accompanied by wife and son who also provide collateral history.  Patient continues to have trouble with sleeping, however they note worsening in the last couple of days due to new onset of right sided neck pain. The pain is constant, dull in nature, and at times radiates to the posterior head. They deny associated dysarthria, dysphagia, facial droop, vision changes, focal weakness, numbness/tingling. 2 nights ago they attempted treating with tylenol PM which did not relieve the pain, however the following day he had gait unsteadiness that was worse than usual. Last night, they tried left-over gabapentin which put the patient to sleep and helped relieve the pain, however it returned this morning.   Yesterday they noted an elevated diastolic BP to 563; since last visit, his BP has been in the 100s so the lisinopril has only been given in the morning; they have continued with amlodipine 5mg  daily and metoprolol 50mg  daily.   Please see problem based Assessment and Plan for status of patients chronic conditions.  Past Medical History:  Diagnosis Date  . Allergy   . Coronary disease    with remote inferior myocardial infarction. Documented occlusion of the right  coronary  . CRI (chronic renal insufficiency)   . Dyslipidemia    Combined with hx of marked hypertriglyceridemia  . Hearing loss   . Hemorrhoids 1979  . Hypertension   . Inguinal hernia September 2012   Bilateral   . Myocardial infarction, old    Remote inferior MI with known occlusion of the RCA.   . Osteoarthritis   . Psoriasis   . Skin cancer     Review of Systems:   Per HPI  Physical Exam:  Vitals:   02/23/18 1330 02/23/18 1411 02/23/18 1412  BP: (!) 140/125 110/71 110/66  Pulse: 65 79 (!) 58  Temp: 97.6 F (36.4 C)    TempSrc: Oral    SpO2: 100%      GENERAL- alert, co-operative, appears as stated age, not in any distress. HEENT- Atraumatic, normocephalic, PERRL, EOMI, oral mucosa appears moist; no reproduction of pain with palpation of post occipital region; no rash; no carotid bruits CARDIAC- RRR, no murmurs, rubs or gallops. RESP- Moving equal volumes of air, and clear to auscultation bilaterally, no wheezes or crackles. NEURO- CN 2-12 intact, strength 5/5 throughout, sensation intact throughout; no arm drift. EXTREMITIES- pulse 2+ radial, symmetric, no pedal edema. SKIN- Warm, dry, no rash or lesion.  Assessment & Plan:   See Encounters Tab for problem based charting.   Patient discussed with Dr. Nilsa Nutting, MD Internal Medicine PGY-3

## 2018-02-23 NOTE — Patient Instructions (Addendum)
For blood pressure: Continue lisinopril 10mg  only once a day Continue amlodipine 5mg  once a day Continue metoprolol 50mg  once a day  For neck pain, try massaging with heat and ice (for some people one works better than the other). You can also try over the counter ibuprofen; I would try to limit it to at most one pill three times a day.   For sleep, try melatonin 3mg  at night.

## 2018-02-23 NOTE — Progress Notes (Signed)
Internal Medicine Clinic Attending  Case discussed with Dr. Chundi at the time of the visit.  We reviewed the resident's history and exam and pertinent patient test results.  I agree with the assessment, diagnosis, and plan of care documented in the resident's note.  Alexander Raines, M.D., Ph.D.  

## 2018-02-27 ENCOUNTER — Telehealth: Payer: Self-pay

## 2018-02-27 ENCOUNTER — Ambulatory Visit: Payer: Medicare Other

## 2018-02-27 DIAGNOSIS — Z7982 Long term (current) use of aspirin: Secondary | ICD-10-CM | POA: Diagnosis not present

## 2018-02-27 DIAGNOSIS — Z8673 Personal history of transient ischemic attack (TIA), and cerebral infarction without residual deficits: Secondary | ICD-10-CM | POA: Diagnosis not present

## 2018-02-27 DIAGNOSIS — B370 Candidal stomatitis: Secondary | ICD-10-CM | POA: Diagnosis not present

## 2018-02-27 DIAGNOSIS — I2510 Atherosclerotic heart disease of native coronary artery without angina pectoris: Secondary | ICD-10-CM | POA: Diagnosis not present

## 2018-02-27 DIAGNOSIS — M6281 Muscle weakness (generalized): Secondary | ICD-10-CM | POA: Diagnosis not present

## 2018-02-27 DIAGNOSIS — E785 Hyperlipidemia, unspecified: Secondary | ICD-10-CM | POA: Diagnosis not present

## 2018-02-27 DIAGNOSIS — I10 Essential (primary) hypertension: Secondary | ICD-10-CM | POA: Diagnosis not present

## 2018-02-27 DIAGNOSIS — Z85828 Personal history of other malignant neoplasm of skin: Secondary | ICD-10-CM | POA: Diagnosis not present

## 2018-02-27 NOTE — Telephone Encounter (Signed)
Magda Paganini, PT with [facility] requesting to extend [facility] PT twice weekly for 2 more weeks to continue working on gait training, balance training and outdoor gait training. Verbal auth given. Will route to PCP for agreement/denial. Hubbard Hartshorn, RN, BSN

## 2018-02-27 NOTE — Telephone Encounter (Signed)
Magda Paganini with [facility] requesting VO. Please call back.

## 2018-03-01 ENCOUNTER — Telehealth (HOSPITAL_COMMUNITY): Payer: Self-pay | Admitting: Cardiology

## 2018-03-01 DIAGNOSIS — M6281 Muscle weakness (generalized): Secondary | ICD-10-CM | POA: Diagnosis not present

## 2018-03-01 DIAGNOSIS — Z8673 Personal history of transient ischemic attack (TIA), and cerebral infarction without residual deficits: Secondary | ICD-10-CM | POA: Diagnosis not present

## 2018-03-01 DIAGNOSIS — I2510 Atherosclerotic heart disease of native coronary artery without angina pectoris: Secondary | ICD-10-CM | POA: Diagnosis not present

## 2018-03-01 DIAGNOSIS — B370 Candidal stomatitis: Secondary | ICD-10-CM | POA: Diagnosis not present

## 2018-03-01 DIAGNOSIS — I10 Essential (primary) hypertension: Secondary | ICD-10-CM | POA: Diagnosis not present

## 2018-03-01 DIAGNOSIS — Z7982 Long term (current) use of aspirin: Secondary | ICD-10-CM | POA: Diagnosis not present

## 2018-03-01 DIAGNOSIS — Z85828 Personal history of other malignant neoplasm of skin: Secondary | ICD-10-CM | POA: Diagnosis not present

## 2018-03-01 DIAGNOSIS — E785 Hyperlipidemia, unspecified: Secondary | ICD-10-CM | POA: Diagnosis not present

## 2018-03-01 NOTE — Telephone Encounter (Signed)
OK with me  Czar Ysaguirre MD, FACC   

## 2018-03-01 NOTE — Telephone Encounter (Signed)
Okay 

## 2018-03-01 NOTE — Telephone Encounter (Signed)
° ° °  Patient requesting to switch from Dr Martinique to Dr Ellyn Hack.  Please advise

## 2018-03-02 ENCOUNTER — Telehealth: Payer: Self-pay | Admitting: Neurology

## 2018-03-02 ENCOUNTER — Ambulatory Visit: Payer: Medicare Other | Admitting: Neurology

## 2018-03-02 ENCOUNTER — Encounter: Payer: Self-pay | Admitting: Neurology

## 2018-03-02 VITALS — BP 125/74 | HR 58 | Ht 71.0 in | Wt 152.4 lb

## 2018-03-02 DIAGNOSIS — F0280 Dementia in other diseases classified elsewhere without behavioral disturbance: Secondary | ICD-10-CM

## 2018-03-02 NOTE — Progress Notes (Signed)
[facility] [address] [place]. [place] [address] [phone]       OFFICE CONSULT NOTE  Mr. [name] Brennan Date of Birth:  [birth year] Medical Record Number:  [id number]   Referring MD: Lars Mage  Reason for Referral:  Memory loss and dementia  HPI: [name] Brennan is a 77 year old male seen today for initial office consultation visit for dementia.he is a component by his wife and son who provide most of the history.I have reviewed electronic medical records and personally reviewed imaging films. The patient presented in August for admission to [facility] with few weeks history of generalized weakness, weight loss And poor appetite and some intermittent confusion. He had been having some sinus infections at that time and was treated with several courses of antibiotics. 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 The patient and family were unaware that he had any focal clinical symptoms compatible with a stroke. Lab work during the hospitalization included in women A1c, B12, TSH and lipid profile, HIV and RPR were all normal. Patient was diagnosed with vascular dementia. No inpatient neurology consultation was made. Patient had oral thrush and it was presumed that a lot of his poor eating was related to that. However that has been treated in the family now feels that he is eating little better but still not eating enough. Patient actually has had some memory impairment even going back to a year or so but in recent months the family acknowledges that it has gotten worse. The patient has also had problems with walking and balance is at frequent falls and he has had at least 5 falls in the last few months.patient denies any pain in his leg, tingling, numbness. He states that he has no warning. He does not lose consciousness. He feels wobbly all the time when walking. On Mini-Mental status exam testing today scored 18/30  with deficits in orientation, attention, recall and groin. He was unable to copy intersecting pentagons. He was able to name only 9 animals with full legs. Clock drawing score was 1/4.  ROS:   14 system review of systems is positive for  Fatigue, weight loss, hearing loss, feeling hot and cold, aching muscles, allergies, not enough sleep, decreased energy, change in appetite, memory loss, confusion, weakness, restless leg, sleepiness and all systems negative  PMH:  Past Medical History:  Diagnosis Date  . Allergy   . Coronary disease    with remote inferior myocardial infarction. Documented occlusion of the right  coronary  . CRI (chronic renal insufficiency)   . Dyslipidemia    Combined with hx of marked hypertriglyceridemia  . Hearing loss   . Hemorrhoids 1979  . Hypertension   . Inguinal hernia September 2012   Bilateral   . Myocardial infarction, old    Remote inferior MI with known occlusion of the RCA.   . Osteoarthritis   . Psoriasis   . Skin cancer     Social History:  Social History   Socioeconomic History  . Marital status: Married    Spouse name: Not on file  . Number of children: 1  . Years of education: Not on file  . Highest education level: Not on file  Occupational History  . Occupation: WELDER    Fish farm manager: [employer]  Social Needs  . Financial resource strain: Not on file  . Food insecurity:    Worry: Not on file    Inability:  Not on file  . Transportation needs:    Medical: Not on file    Non-medical: Not on file  Tobacco Use  . Smoking status: Never Smoker  . Smokeless tobacco: Never Used  Substance and Sexual Activity  . Alcohol use: No  . Drug use: No  . Sexual activity: Not on file  Lifestyle  . Physical activity:    Days per week: Not on file    Minutes per session: Not on file  . Stress: Not on file  Relationships  . Social connections:    Talks on phone: Not on file    Gets together: Not on file    Attends religious  service: Not on file    Active member of club or organization: Not on file    Attends meetings of clubs or organizations: Not on file    Relationship status: Not on file  . Intimate partner violence:    Fear of current or ex partner: Not on file    Emotionally abused: Not on file    Physically abused: Not on file    Forced sexual activity: Not on file  Other Topics Concern  . Not on file  Social History Narrative  . Not on file    Medications:   Current Outpatient Medications on File Prior to Visit  Medication Sig Dispense Refill  . amLODipine (NORVASC) 5 MG tablet Take 1 tablet (5 mg total) by mouth daily. 180 tablet 0  . aspirin 81 MG tablet Take 81 mg by mouth daily.      . fenofibrate 160 MG tablet TAKE 1 TABLET BY MOUTH EVERY DAY 30 tablet 5  . GuaiFENesin (MUCUS RELIEF ADULT PO) Take by mouth as needed.     Marland Kitchen lisinopril (PRINIVIL,ZESTRIL) 10 MG tablet Take 1 tablet (10 mg total) by mouth daily. 180 tablet 2  . Melatonin 5 MG TABS Take 1 tablet (5 mg total) by mouth at bedtime as needed. 30 tablet 0  . metoprolol (LOPRESSOR) 50 MG tablet Take 1 tablet (50 mg total) by mouth daily. 90 tablet 3  . nystatin (MYCOSTATIN) 100000 UNIT/ML suspension Take by mouth.    . rosuvastatin (CRESTOR) 20 MG tablet Take 20 mg by mouth daily.  1  . rosuvastatin (CRESTOR) 20 MG tablet Take 20 mg by mouth daily.    . sennosides-docusate sodium (SENOKOT-S) 8.6-50 MG tablet Take 1 tablet by mouth 2 (two) times daily.    . sertraline (ZOLOFT) 25 MG tablet Take 1 tablet (25 mg total) by mouth daily. 30 tablet 0  . triamcinolone (KENALOG) 0.1 % cream Apply 1 application topically daily as needed (rash).     . nystatin (MYCOSTATIN) 100000 UNIT/ML suspension Take 5 mLs (500,000 Units total) by mouth 4 (four) times daily. (Patient not taking: Reported on 03/02/2018) 60 mL 0   No current facility-administered medications on file prior to visit.     Allergies:   Allergies  Allergen Reactions  .  Amoxicillin Swelling  . Atorvastatin Swelling  . Avalide [Irbesartan-Hydrochlorothiazide]     Apparently Caused an increase in Creatinine  . Cymbalta [Duloxetine Hcl]     Anaphylactic    . Escitalopram Oxalate     Hives  . Fenofibrate     Intolerant     Physical Exam General: frail cachectic elderly male seated, in no evident distress Head: head normocephalic and atraumatic.   Neck: supple with no carotid or supraclavicular bruits Cardiovascular: regular rate and rhythm, soft ejection systolic murmur over precordium. Musculoskeletal:  no deformity Skin:  no rash/petichiae Vascular:  Normal pulses all extremities  Neurologic Exam Mental Status: Awake and fully alert. Oriented to place and time. Recent and remote memory intact. Attention span, concentration and fund of knowledge appropriate. Mood and affect appropriate. Mini-Mental status exam testing today scored 18/30 with deficits in orientation, attention, recall and groin. He was unable to copy intersecting pentagons. He was able to name only 9 animals with full legs. Clock drawing score was 1/4. Cranial Nerves: Fundoscopic exam reveals sharp disc margins. Pupils equal, briskly reactive to light. Extraocular movements full without nystagmus. Visual fields full to confrontation. Hearing intact. Facial sensation intact. Face, tongue, palate moves normally and symmetrically.  Motor: reduced bulk but normaltone. Normal strength in all tested extremity muscles. Sensory.: intact to touch , pinprick , position but diminished vibratory sensationover toes bilaterally..  Coordination: Rapid alternating movements normal in all extremities. Finger-to-nose and heel-to-shin performed accurately bilaterally.positive Romberg sign. Unsteady on a narrow base Gait and Station: Arises from chair with  difficulty. Stance is slightly stooped Gait wide-based and ataxic. Marland Kitchen Unable to heel, toe and tandem walk   Reflexes: 1+ and symmetricexcept both knee jerks  are depressed and ankle jerks are absent.. Toes downgoing.     Modified Rankin  3   ASSESSMENT: 77 year old male with subacute decline in gait balance and memory loss of unclear etiology.     PLAN: I had a long discussion with the patient, wife and son regarding his subacute decline with increasing falls, gait ataxia, decreased appetite and cognitive impairment and discuss plan for evaluation and answered questions. I recommend the check neuropathy panel labs, EMG nerve conduction study, MRI scan of the brain, MRA of the brain, EEG. Continue ongoing physical therapy and to discuss fall and safety prevention precautions. Continue aspirin for stroke prevention with strict control of hypertension with blood pressure goal below 130/90 and lipids with LDL cholesterol goal below 70 mg percent.greater than 50% time during this 45 minute consultation visit was spent on counseling and coordination of care about his subacute memory loss and cognitive impairment as well as worsening gait and balance difficulties and answered questionsHe will return for follow-up in 2 months or call earlier if necessary. Antony Contras, MD  [facility] [address] [place], [place] [address]  Phone [phone] Fax [phone] Note: This document was prepared with digital dictation and possible smart phrase technology. Any transcriptional errors that result from this process are unintentional.

## 2018-03-02 NOTE — Telephone Encounter (Signed)
[employer] Medicare order sent to [facility]. No auth they will reach out to the pt to schedule.  °

## 2018-03-02 NOTE — Patient Instructions (Signed)
I had a long discussion with the patient, wife and son regarding his subacute decline with increasing falls, gait ataxia, decreased appetite and cognitive impairment and discuss plan for evaluation and answered questions. I recommend the check neuropathy panel labs, EMG nerve conduction study, MRI scan of the brain, MRA of the brain, EEG. Continue ongoing physical therapy and to discuss fall and safety prevention precautions. Continue aspirin for stroke prevention with strict control of hypertension with blood pressure goal below 130/90 and lipids with LDL cholesterol goal below 70 mg percent.He will return for follow-up in 2 months or call earlier if necessary.  Fall Prevention in the Home Falls can cause injuries. They can happen to people of all ages. There are many things you can do to make your home safe and to help prevent falls. What can I do on the outside of my home?  Regularly fix the edges of walkways and driveways and fix any cracks.  Remove anything that might make you trip as you walk through a door, such as a raised step or threshold.  Trim any bushes or trees on the path to your home.  Use bright outdoor lighting.  Clear any walking paths of anything that might make someone trip, such as rocks or tools.  Regularly check to see if handrails are loose or broken. Make sure that both sides of any steps have handrails.  Any raised decks and porches should have guardrails on the edges.  Have any leaves, snow, or ice cleared regularly.  Use sand or salt on walking paths during winter.  Clean up any spills in your garage right away. This includes oil or grease spills. What can I do in the bathroom?  Use night lights.  Install grab bars by the toilet and in the tub and shower. Do not use towel bars as grab bars.  Use non-skid mats or decals in the tub or shower.  If you need to sit down in the shower, use a plastic, non-slip stool.  Keep the floor dry. Clean up any water  that spills on the floor as soon as it happens.  Remove soap buildup in the tub or shower regularly.  Attach bath mats securely with double-sided non-slip rug tape.  Do not have throw rugs and other things on the floor that can make you trip. What can I do in the bedroom?  Use night lights.  Make sure that you have a light by your bed that is easy to reach.  Do not use any sheets or blankets that are too big for your bed. They should not hang down onto the floor.  Have a firm chair that has side arms. You can use this for support while you get dressed.  Do not have throw rugs and other things on the floor that can make you trip. What can I do in the kitchen?  Clean up any spills right away.  Avoid walking on wet floors.  Keep items that you use a lot in easy-to-reach places.  If you need to reach something above you, use a strong step stool that has a grab bar.  Keep electrical cords out of the way.  Do not use floor polish or wax that makes floors slippery. If you must use wax, use non-skid floor wax.  Do not have throw rugs and other things on the floor that can make you trip. What can I do with my stairs?  Do not leave any items on the stairs.  Make  sure that there are handrails on both sides of the stairs and use them. Fix handrails that are broken or loose. Make sure that handrails are as long as the stairways.  Check any carpeting to make sure that it is firmly attached to the stairs. Fix any carpet that is loose or worn.  Avoid having throw rugs at the top or bottom of the stairs. If you do have throw rugs, attach them to the floor with carpet tape.  Make sure that you have a light switch at the top of the stairs and the bottom of the stairs. If you do not have them, ask someone to add them for you. What else can I do to help prevent falls?  Wear shoes that: ? Do not have high heels. ? Have rubber bottoms. ? Are comfortable and fit you well. ? Are closed at  the toe. Do not wear sandals.  If you use a stepladder: ? Make sure that it is fully opened. Do not climb a closed stepladder. ? Make sure that both sides of the stepladder are locked into place. ? Ask someone to hold it for you, if possible.  Clearly mark and make sure that you can see: ? Any grab bars or handrails. ? First and last steps. ? Where the edge of each step is.  Use tools that help you move around (mobility aids) if they are needed. These include: ? Canes. ? Walkers. ? Scooters. ? Crutches.  Turn on the lights when you go into a dark area. Replace any light bulbs as soon as they burn out.  Set up your furniture so you have a clear path. Avoid moving your furniture around.  If any of your floors are uneven, fix them.  If there are any pets around you, be aware of where they are.  Review your medicines with your doctor. Some medicines can make you feel dizzy. This can increase your chance of falling. Ask your doctor what other things that you can do to help prevent falls. This information is not intended to replace advice given to you by your health care provider. Make sure you discuss any questions you have with your health care provider. Document Released: 04/03/2009 Document Revised: 11/13/2015 Document Reviewed: 07/12/2014 Elsevier Interactive Patient Education  [employer].

## 2018-03-02 NOTE — Telephone Encounter (Signed)
Please agree or deny. Thank you. Hubbard Hartshorn, RN, BSN

## 2018-03-06 NOTE — Telephone Encounter (Signed)
I agree with that change. Thank you!

## 2018-03-07 ENCOUNTER — Other Ambulatory Visit: Payer: Self-pay | Admitting: Internal Medicine

## 2018-03-08 DIAGNOSIS — I2510 Atherosclerotic heart disease of native coronary artery without angina pectoris: Secondary | ICD-10-CM | POA: Diagnosis not present

## 2018-03-08 DIAGNOSIS — I10 Essential (primary) hypertension: Secondary | ICD-10-CM | POA: Diagnosis not present

## 2018-03-08 DIAGNOSIS — B370 Candidal stomatitis: Secondary | ICD-10-CM | POA: Diagnosis not present

## 2018-03-08 DIAGNOSIS — Z7982 Long term (current) use of aspirin: Secondary | ICD-10-CM | POA: Diagnosis not present

## 2018-03-08 DIAGNOSIS — Z8673 Personal history of transient ischemic attack (TIA), and cerebral infarction without residual deficits: Secondary | ICD-10-CM | POA: Diagnosis not present

## 2018-03-08 DIAGNOSIS — E785 Hyperlipidemia, unspecified: Secondary | ICD-10-CM | POA: Diagnosis not present

## 2018-03-08 DIAGNOSIS — M6281 Muscle weakness (generalized): Secondary | ICD-10-CM | POA: Diagnosis not present

## 2018-03-08 DIAGNOSIS — Z85828 Personal history of other malignant neoplasm of skin: Secondary | ICD-10-CM | POA: Diagnosis not present

## 2018-03-09 ENCOUNTER — Ambulatory Visit: Payer: Medicare Other

## 2018-03-09 DIAGNOSIS — R410 Disorientation, unspecified: Secondary | ICD-10-CM | POA: Diagnosis not present

## 2018-03-09 DIAGNOSIS — F0280 Dementia in other diseases classified elsewhere without behavioral disturbance: Secondary | ICD-10-CM

## 2018-03-10 DIAGNOSIS — I10 Essential (primary) hypertension: Secondary | ICD-10-CM | POA: Diagnosis not present

## 2018-03-10 DIAGNOSIS — Z7982 Long term (current) use of aspirin: Secondary | ICD-10-CM | POA: Diagnosis not present

## 2018-03-10 DIAGNOSIS — Z8673 Personal history of transient ischemic attack (TIA), and cerebral infarction without residual deficits: Secondary | ICD-10-CM | POA: Diagnosis not present

## 2018-03-10 DIAGNOSIS — Z85828 Personal history of other malignant neoplasm of skin: Secondary | ICD-10-CM | POA: Diagnosis not present

## 2018-03-10 DIAGNOSIS — E785 Hyperlipidemia, unspecified: Secondary | ICD-10-CM | POA: Diagnosis not present

## 2018-03-10 DIAGNOSIS — M6281 Muscle weakness (generalized): Secondary | ICD-10-CM | POA: Diagnosis not present

## 2018-03-10 DIAGNOSIS — B370 Candidal stomatitis: Secondary | ICD-10-CM | POA: Diagnosis not present

## 2018-03-10 DIAGNOSIS — I2510 Atherosclerotic heart disease of native coronary artery without angina pectoris: Secondary | ICD-10-CM | POA: Diagnosis not present

## 2018-03-10 LAB — NEUROPATHY PANEL
A/G RATIO SPE: 1.2 (ref 0.7–1.7)
ALPHA 2: 0.7 g/dL (ref 0.4–1.0)
ANA: NEGATIVE
Albumin ELP: 3.9 g/dL (ref 2.9–4.4)
Alpha 1: 0.2 g/dL (ref 0.0–0.4)
Beta: 1.3 g/dL (ref 0.7–1.3)
GLOBULIN, TOTAL: 3.2 g/dL (ref 2.2–3.9)
Gamma Globulin: 1 g/dL (ref 0.4–1.8)
Rhuematoid fact SerPl-aCnc: <10 IU/mL (ref 0.0–13.9)
SED RATE: 42 mm/h — AB (ref 0–30)
TOTAL PROTEIN: 7.1 g/dL (ref 6.0–8.5)
TSH: 1.1 u[IU]/mL (ref 0.450–4.500)
VIT D 25 HYDROXY: 50.8 ng/mL (ref 30.0–100.0)
Vitamin B-12: 508 pg/mL (ref 232–1245)

## 2018-03-10 LAB — METHYLMALONIC ACID, SERUM: METHYLMALONIC ACID: 353 nmol/L (ref 0–378)

## 2018-03-10 LAB — HOMOCYSTEINE: HOMOCYSTEINE: 23.3 umol/L — AB (ref 0.0–15.0)

## 2018-03-13 ENCOUNTER — Other Ambulatory Visit: Payer: Self-pay

## 2018-03-13 ENCOUNTER — Ambulatory Visit (INDEPENDENT_AMBULATORY_CARE_PROVIDER_SITE_OTHER): Payer: Medicare Other | Admitting: Internal Medicine

## 2018-03-13 ENCOUNTER — Encounter: Payer: Self-pay | Admitting: Internal Medicine

## 2018-03-13 VITALS — BP 102/66 | HR 56 | Temp 98.7°F | Ht 71.0 in | Wt 155.6 lb

## 2018-03-13 DIAGNOSIS — I2510 Atherosclerotic heart disease of native coronary artery without angina pectoris: Secondary | ICD-10-CM

## 2018-03-13 DIAGNOSIS — M542 Cervicalgia: Secondary | ICD-10-CM | POA: Diagnosis not present

## 2018-03-13 DIAGNOSIS — I10 Essential (primary) hypertension: Secondary | ICD-10-CM

## 2018-03-13 DIAGNOSIS — Z79899 Other long term (current) drug therapy: Secondary | ICD-10-CM

## 2018-03-13 DIAGNOSIS — R519 Headache, unspecified: Secondary | ICD-10-CM

## 2018-03-13 DIAGNOSIS — R51 Headache: Secondary | ICD-10-CM

## 2018-03-13 DIAGNOSIS — F0150 Vascular dementia without behavioral disturbance: Secondary | ICD-10-CM

## 2018-03-13 MED ORDER — MELATONIN 5 MG PO TABS: 5.0000 mg | ORAL_TABLET | Freq: Every evening | ORAL | 1 refills | Status: DC | PRN

## 2018-03-13 NOTE — Assessment & Plan Note (Signed)
Patient with improvement in symptoms of lability, sleep disturbance and decreased PO intake since starting sertraline 25mg  daily and melatonin qhs prn. PHQ 9 score is 7 today (from 15 last month).  Plan: --continue sertraline 25mg  daily; has only been on it for 2 weeks and already having significant improvement --can adjust melatonin to 5-10mg  qhs PRN --he will complete neuro workup and f/u with neuro in Nov.

## 2018-03-13 NOTE — Patient Instructions (Addendum)
For blood pressure: Stop amlodipine Continue lisinopril 10mg  daily  Continue metoprolol 50mg  daily  For neck pain: Continue using heat, and aspercreme Can also try Capsaicin creme  For sleep, can take up to 2 tabs of melatonin if needed.

## 2018-03-13 NOTE — Assessment & Plan Note (Signed)
Patient with continued pain, however it is controlled with heat and topical nsaid further pointing to MSK cause.   Plan: --continue current regimen; advised they can also use prn capsaicin creme

## 2018-03-13 NOTE — Assessment & Plan Note (Addendum)
Due to lability of BP and already high risk of falls, will stop amlodipine 5mg  daily for now and re-assess in a few weeks.  Plan: --stop amlodipine 5mg  daily --continue lisinopril 10mg  daily and metoprolol 50mg  daily --f/u in 4-6wks

## 2018-03-13 NOTE — Progress Notes (Signed)
   CC: dementia  HPI:  [name] Brennan is a 77 y.o. with a PMH of dementia, HTN, CAD presenting to clinic for follow up on his vascular dementia and HTN.  Since last visit, patient has been taking sertraline 25mg  daily as well as PRN qhs melatonin; wife and patient report improvement in sleep, agitation, and PO intake gradually since initiation of these medicines.   He also notes continued left neck pain, however it is relieved with heat and aspercreme.  Wife has noted labile blood pressures with SBP ranging from 90s-130s; he is asymptomatic.   He was seen by neurology, who obtained neuropathy panel (unremarkable), will obtain EEG, as well as MRI and MRA of the brain for further assessment of his dementia, gait instability and old R MCA infarct.   Patient denies falls, worsening or focal weakness, abd pain, nausea, vomiting, diarrhea, chest pain, shortness of breath.  Please see problem based Assessment and Plan for status of patients chronic conditions.  Past Medical History:  Diagnosis Date  . Allergy   . Coronary disease    with remote inferior myocardial infarction. Documented occlusion of the right  coronary  . CRI (chronic renal insufficiency)   . Dyslipidemia    Combined with hx of marked hypertriglyceridemia  . Hearing loss   . Hemorrhoids 1979  . Hypertension   . Inguinal hernia September 2012   Bilateral   . Myocardial infarction, old    Remote inferior MI with known occlusion of the RCA.   . Osteoarthritis   . Psoriasis   . Skin cancer     Review of Systems:   Per HPI  Physical Exam:  Vitals:   03/13/18 1327  BP: 102/66  Pulse: (!) 56  Temp: 98.7 F (37.1 C)  TempSrc: Oral  SpO2: 99%  Weight: 155 lb 9.6 oz (70.6 kg)  Height: 5\' 11"  (1.803 m)   GENERAL- alert, co-operative, appears as stated age, not in any distress. HEENT- EOMI, oral mucosa appears moist. CARDIAC- RRR, no murmurs, rubs or gallops. RESP- Moving equal volumes of air, and clear to  auscultation bilaterally, no wheezes or crackles. ABDOMEN- Soft, nontender, bowel sounds present. NEURO- CN 2-12 intact, strength 5/5 throughout; no arm drift. EXTREMITIES- pulse 2+ PT/radial, symmetric, no pedal edema. PSYCH- Normal mood and affect  Assessment & Plan:   See Encounters Tab for problem based charting.   Patient discussed with Dr. Abelardo Diesel, MD Internal Medicine PGY-3

## 2018-03-13 NOTE — Addendum Note (Signed)
Addended by: Alphonzo Grieve on: 03/13/2018 05:49 PM   Modules accepted: Orders

## 2018-03-14 ENCOUNTER — Telehealth: Payer: Self-pay | Admitting: *Deleted

## 2018-03-14 NOTE — Progress Notes (Signed)
Internal Medicine Clinic Attending  Case discussed with Dr. Svalina  at the time of the visit.  We reviewed the resident's history and exam and pertinent patient test results.  I agree with the assessment, diagnosis, and plan of care documented in the resident's note.  

## 2018-03-14 NOTE — Telephone Encounter (Signed)
LVM informing the patient that most of the neuropathy panel labs were unremarkable.   Advised he needs to start taking folic acid 1 mg daily which is an over-the-counter supplement. Also advised he quit smoking in case he smokes.  Left number for any questions.

## 2018-03-15 NOTE — Telephone Encounter (Addendum)
Received a call from wife, [name] Brennan on [place] asking for further information on lab results. This RN reviewed Dr Clydene Fake notes with her. She stated her husband is still not eating well, said she stopped his MVI because of the sodium content. She stated "he can't take some stuff". This RN stated that Dr Leonie Man recommended Folic acid based on his lab results. Suggested that if he isn't eating well a MVI might be helpful, and she could ask pharmacist about the best one with low sodium, or read labels. She asked if he would feel better if he ate a better diet. This RN advised that he would most likely feel better if he ate well, and healthy meals. She stated she is trying to make him eat. This RN suggested she find out what he likes best and offer.  She verbalized understanding, appreciation.

## 2018-03-16 ENCOUNTER — Ambulatory Visit: Payer: Medicare Other | Admitting: Cardiology

## 2018-03-17 ENCOUNTER — Other Ambulatory Visit: Payer: Self-pay | Admitting: Neurology

## 2018-03-17 MED ORDER — FOLIC ACID 1 MG PO TABS: 1.0000 mg | ORAL_TABLET | Freq: Every day | ORAL | 3 refills | Status: AC

## 2018-03-17 NOTE — Telephone Encounter (Signed)
Patient's wife called and stated that the pharmacy did not have OTC 1mg  folic acid. The pharmacist told her she would need a script for this. She wants to know what she should do. Please call and advise.

## 2018-03-17 NOTE — Telephone Encounter (Signed)
Ok will order in epic

## 2018-03-20 ENCOUNTER — Ambulatory Visit
Admission: RE | Admit: 2018-03-20 | Discharge: 2018-03-20 | Disposition: A | Discharge status: Home / Self Care | Payer: Medicare Other | Source: Ambulatory Visit | Attending: Neurology | Admitting: Neurology

## 2018-03-20 DIAGNOSIS — F0280 Dementia in other diseases classified elsewhere without behavioral disturbance: Secondary | ICD-10-CM

## 2018-03-20 MED ORDER — GADOBENATE DIMEGLUMINE 529 MG/ML IV SOLN
14.0000 mL | Freq: Once | INTRAVENOUS | Status: AC | PRN
  Administered 2018-03-20: 14 mL via INTRAVENOUS

## 2018-03-22 ENCOUNTER — Telehealth: Payer: Self-pay

## 2018-03-22 NOTE — Telephone Encounter (Signed)
The wife requested EEG results. Rn stated a note will be sent to DR. Leonie Man about pts wife wanting to know the EEG results.

## 2018-03-22 NOTE — Telephone Encounter (Signed)
-----   Message from Garvin Fila, MD sent at 03/22/2018  3:23 PM EDT ----- [name] Brennan call the patient and informed him that MRI scan of the brain shows an old stroke on the right side of the brain no new or worrisome finding. This appears unchanged compared with previous CT scan from 1 month ago. MRA of the brain shows no significant blockages in the major blood vessels in the brain. No new or worrisome finding

## 2018-03-22 NOTE — Telephone Encounter (Signed)
Rn spoke with Vaughan Basta pts wife about MR brain,and MR head results. Rn stated per DR.SEthi note the MRI brain shows old stroke on right side of the brain no new or worrisome findings. It appears unchanged with previous Ct scan from a month ago.   MRA head shows no significant blockages in the major blood vessels in the brain, no new or worrisome findings. The verbalized understanding,and wrote down both results. ------

## 2018-03-23 NOTE — Telephone Encounter (Signed)
Kindly inform the patient that EEG shows mild generalized slowing of brain activity which may be age-related but no definite epileptiform activity. No worrisome findings

## 2018-03-27 NOTE — Telephone Encounter (Signed)
Left vm for patients wife to call back about EEG results.

## 2018-03-27 NOTE — Telephone Encounter (Signed)
Pt's wife returned RN's call °

## 2018-03-27 NOTE — Telephone Encounter (Signed)
RN call wife that the EEG shows slowing of the brain activity which may be age related but no definite epileptiform activity.NO worrisome findings.The wife verbalized understanding.

## 2018-03-29 ENCOUNTER — Encounter: Payer: Self-pay | Admitting: Cardiology

## 2018-03-29 ENCOUNTER — Ambulatory Visit: Payer: Medicare Other | Admitting: Cardiology

## 2018-03-29 VITALS — BP 132/86 | HR 53 | Ht 71.0 in | Wt 149.0 lb

## 2018-03-29 DIAGNOSIS — I2510 Atherosclerotic heart disease of native coronary artery without angina pectoris: Secondary | ICD-10-CM | POA: Diagnosis not present

## 2018-03-29 DIAGNOSIS — E785 Hyperlipidemia, unspecified: Secondary | ICD-10-CM

## 2018-03-29 DIAGNOSIS — I499 Cardiac arrhythmia, unspecified: Secondary | ICD-10-CM

## 2018-03-29 DIAGNOSIS — I10 Essential (primary) hypertension: Secondary | ICD-10-CM

## 2018-03-29 DIAGNOSIS — F01518 Vascular dementia, unspecified severity, with other behavioral disturbance: Secondary | ICD-10-CM

## 2018-03-29 DIAGNOSIS — F0151 Vascular dementia with behavioral disturbance: Secondary | ICD-10-CM

## 2018-03-29 NOTE — Progress Notes (Signed)
PCP: Asencion Noble, MD  Previously seen by Dr. Martinique  Clinic Note: Chief Complaint  Patient presents with  . Hospitalization Follow-up    And to transition care from Dr. Martinique to Dr. Ellyn Hack    HPI: [name] Brennan is a 77 y.o. male with a history of single-vessel coronary disease with RCA CTO filled via collateral flow being treated medically.  Following his recent hospitalization with confusion and dementia etc., he and the family are seeking new ideas from physicians and therefore changing all of his doctors around.  His grandson has been a patient of mine and therefore the family suggested that he sees me.   He presents today for hospital follow-up and initiating new cardiology care to discuss "IRREGULAR HEARTBEATS".They "just want to find out what is going on".  [name] Brennan was last seen on December 01, 2017 by Dr. Martinique basically for annual follow-up --> he was not having any symptoms of chest pain or dyspnea with rest or exertion.  Had cut back on his walking due to a bad fall that he had previous year.  He was still active and doing his yard work etc.  No medication changes.  Continued aspirin, Crestor, amlodipine and metoprolol.  Blood pressure was controlled and LDL was close to 70.  Recent Hospitalizations:   02/06/2018: He was admitted for profound weakness -- Roughly 2 3 weeks of generalized weakness, weight loss and decreased appetite with intermittent confusion.  Was thought to be secondary to potential sinus infections that were treated with amoxicillin.  Unfortunately he had swelling with amoxicillin and stopped taking it.  Also noted decline in memory and neurologic function.Noted to have thrush.  Therefore HIV was checked and that was negative.  This could have explained his change in taste and lack of appetite.   No focal deficits on exam.  No significant CBC or CMP findings.  Head CT showed small vessel changes and old R-MCA infarction  Home health PT OT  recommended.  Thought to have a diagnosis of vascular dementia with stepwise decline in mental status and intermittent confusion.  Discharged on Nystatin for oral thrushApparently someone had mentioned something about some irregular heartbeat things seen on monitor.  The family then hung on to this as a potential causative agent for his stroke.  I honestly think that they felt that the stroke was recent despite being told several times it was not.  Following this hospitalization, they chose to change his PCP to someone in the [facility], they are now seeing neurology and have been doing home health PT OT.  Studies Personally Reviewed - (if available, images/films reviewed: From Epic Chart or Care Everywhere)  MRI, a of the HEAD/BRAIN 03/20/2018: Moderate to severe atrophy.  Moderate chronic small vessel ischemic disease.  Chronic right frontal and parietal ischemic infarctions. --> No "new findings".  MRA -No focal stenosis or occlusion of medium to large sized branches.  Interval History: [name] Brennan presents here today accompanied by his grandson and daughter.  He is very jovial and quick to crack a joke.  He clearly has some issues with memory and is showing signs of cognitive decline.  He does not really answer questions very well.  He answers with a choking tries to joke about being confused. He says he is usually able to walk around the house and do whatever he wants to do without any chest tightness or pressure.  He has no sensation of rapid irregular heartbeats  or palpitations but was told that he had irregular heartbeats in the hospital.  Cardiac review of symptoms: No chest pain or shortness of breath with rest or exertion --> Does not necessarily exercise much but with PT and OT not having symptoms. No PND, orthopnea or edema.  No palpitations, syncope/near syncope or acute TIA/amaurosis fugax symptoms --> But he has noted some orthostatic dizziness partly  because not eating and drinking as well as he had been. No melena, hematochezia, hematuria, or epstaxis. No claudication.  ROS: A comprehensive was performed. Review of Systems  Constitutional: Positive for weight loss (Due to reduced p.o. intake). Negative for chills, fever and malaise/fatigue (Does not have great energy, but is not fatigued).  HENT: Positive for hearing loss. Negative for congestion and sinus pain.   Respiratory: Negative for cough, shortness of breath and wheezing.   Gastrointestinal: Negative for abdominal pain, diarrhea, heartburn, nausea and vomiting.  Genitourinary: Negative for dysuria.  Musculoskeletal: Positive for neck pain (Right side of his neck).  Skin: Negative for rash.  Neurological: Positive for dizziness (Orthostatic).       Worsening memory and cognition.  Not as quick to answer questions.  Somewhat unsteady gait.  Poor balance.  No focal changes  Psychiatric/Behavioral: Positive for memory loss.  All other systems reviewed and are negative.  I have reviewed and (if needed) personally updated the patient's problem list, medications, allergies, past medical and surgical history, social and family history.   Past Medical History:  Diagnosis Date  . Allergy   . CRI (chronic renal insufficiency)   . Dyslipidemia    Combined with hx of marked hypertriglyceridemia  . Hearing loss   . Hemorrhoids 1979  . Hypertension   . Inguinal hernia September 2012   Bilateral   . Myocardial infarction, old    Remote inferior MI with known occlusion of the RCA.  Medical management  . Occlusive coronary artery disease    History of remote inferior MI.  Documented CTO of RCA with left-to-right collaterals -> treated medically.  NO STENT.  . Osteoarthritis   . Psoriasis   . Skin cancer     Past Surgical History:  Procedure Laterality Date  . CARDIAC CATHETERIZATION  01/13/2004   Single-vessel CAD- mRCA CTO with left-to-right collaterals & faint R-R collaterals.   EF 50-55%  . cataract     with lens implants  . HEMORROIDECTOMY    . INGUINAL HERNIA REPAIR  03/11/2011   bilateral    Current Meds  Medication Sig  . aspirin 81 MG tablet Take 81 mg by mouth daily.    . fenofibrate 160 MG tablet TAKE 1 TABLET BY MOUTH EVERY DAY  . folic acid (FOLVITE) 1 MG tablet Take 1 tablet (1 mg total) by mouth daily.  . GuaiFENesin (MUCUS RELIEF ADULT PO) Take by mouth as needed.   Marland Kitchen lisinopril (PRINIVIL,ZESTRIL) 10 MG tablet Take 1 tablet (10 mg total) by mouth daily.  . Melatonin 5 MG TABS Take 1-2 tablets (5-10 mg total) by mouth at bedtime as needed.  . metoprolol (LOPRESSOR) 50 MG tablet Take 1 tablet (50 mg total) by mouth daily.  . rosuvastatin (CRESTOR) 20 MG tablet Take 20 mg by mouth daily.  . sennosides-docusate sodium (SENOKOT-S) 8.6-50 MG tablet Take 1 tablet by mouth 2 (two) times daily.  . sertraline (ZOLOFT) 25 MG tablet TAKE 1 TABLET BY MOUTH EVERY DAY  . triamcinolone (KENALOG) 0.1 % cream Apply 1 application topically daily as needed (rash).  Allergies  Allergen Reactions  . Amoxicillin Swelling  . Atorvastatin Swelling  . Avalide [Irbesartan-Hydrochlorothiazide]     Apparently Caused an increase in Creatinine  . Cymbalta [Duloxetine Hcl]     Anaphylactic    . Escitalopram Oxalate     Hives  . Fenofibrate     Intolerant     Social History   Tobacco Use  . Smoking status: Never Smoker  . Smokeless tobacco: Never Used  Substance Use Topics  . Alcohol use: No  . Drug use: No   Social History   Social History Narrative  . Not on file    family history includes Heart attack (age of onset: 58) in his father; Prostate cancer in his brother; Skin cancer in his brother; Unexplained death in his mother.  Wt Readings from Last 3 Encounters:  03/29/18 149 lb (67.6 kg)  03/13/18 155 lb 9.6 oz (70.6 kg)  03/02/18 152 lb 6.4 oz (69.1 kg)    PHYSICAL EXAM BP 132/86   Pulse (!) 53   Ht 5\' 11"  (1.803 m)   Wt 149 lb (67.6 kg)    SpO2 99%   BMI 20.78 kg/m   Physical Exam  Constitutional: He is oriented to person, place, and time. He appears well-developed and well-nourished. No distress.  Outwardly healthy-appearing.  Well-groomed.  HENT:  Head: Normocephalic and atraumatic.  Mouth/Throat: Oropharynx is clear and moist.  Eyes: EOM are normal.  Neck: Normal range of motion. Neck supple. No hepatojugular reflux and no JVD present. Carotid bruit is not present.  Cardiovascular: Normal rate, regular rhythm, normal heart sounds and intact distal pulses.  No extrasystoles are present. PMI is not displaced. Exam reveals no gallop and no friction rub.  No murmur heard. Pulmonary/Chest: Effort normal and breath sounds normal. No respiratory distress. He has no wheezes. He has no rales.  Abdominal: Soft. Bowel sounds are normal. He exhibits no distension. There is no tenderness. There is no rebound.  Musculoskeletal: Normal range of motion. He exhibits no edema.  Neurological: He is alert and oriented to person, place, and time.  Poor historian.   Psychiatric: He has a normal mood and affect.   Jokes instead answering questions.  Laughs about inappropriate things  Vitals reviewed.    Adult ECG Report - Not checked  Other studies Reviewed: Additional studies/ records that were reviewed today include:  Recent Labs:   Lab Results  Component Value Date   CHOL 89 02/07/2018   HDL 29 (L) 02/07/2018   LDLCALC 37 02/07/2018   TRIG 117 02/07/2018   CHOLHDL 3.1 02/07/2018   Lab Results  Component Value Date   CREATININE 1.06 02/07/2018   BUN 36 (H) 02/07/2018   NA 138 02/07/2018   K 3.9 02/07/2018   CL 110 02/07/2018   CO2 19 (L) 02/07/2018   Lab Results  Component Value Date   HGBA1C 5.8 (H) 02/06/2018    ASSESSMENT / PLAN: Problem List Items Addressed This Visit    Dyslipidemia (Chronic)    LDL 37 with total cholesterol 89 on current dose of Crestor.  Was restarted back in the hospital at 20 mg.  At this  point I think were any Catch-22 I would like to see if may be stopping his statin would help his memory issues.  We can discuss this in follow-up.      Hypertension (Chronic)    Has had labile blood pressures in the past.  I would prefer for him to have permissive hypertension, especially  because of risk for falls.  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  Will need to contact him to have him take this 1/2 tablet twice daily.  He is also taking lisinopril 10 mg.      Irregular heartbeat - Primary    Has had EKGs with PAC & PVCs --I guess he and the family are concerned that he may have A. fib and prep perhaps that is the cause of his stroke.-- will check 48 hr monitor to determine burden or if any arrhythmia present      Relevant Orders   HOLTER MONITOR - 1 HOUR   Occlusive coronary artery disease (Chronic)    Known RCA occlusion with likely remote inferior MI.  I do not see any prior echocardiogram, but his LV gram at the time showed inferior/diaphragmatic hypokinesis.. In many years he has refused any follow-up stress testing or echoes.  As such, I do not really think we need to check 1.  Check an echocardiogram may not be a bad idea for source of stroke, but the stroke was old and therefore not sure how beneficial it would be. Simply continue aspirin and beta-blocker (although we need exchanged to correct dosing). He is on statin but I am thinking about potentially holding that to evaluate change of memory issues.      Vascular dementia ([place]) (Chronic)    Being followed by neurology.  Would not want his blood pressure to be too high, but current levels are okay. Am not sure the benefit for statins in this situation.  May consider holding statin because of possible memory loss issues associated with  it.         I spent a total of 45 minutes with the patient and chart review. >  50% of the time was spent in direct patient consultation.  We reviewed his hospital stay & relevant studies with results.  Discussed reasons for changing Cardiologist.  > 50% of the time was with direct patient/family conversation H&P data then discussing Recommended study options.  Current medicines are reviewed at length with the patient today.  (+/- concerns) n/a The following changes have been made:  n/a  Patient Instructions  Medication Instructions:  Your Physician recommend you continue on your current medication as directed.    If you need a refill on your cardiac medications before your next appointment, please call your pharmacy.   Lab work: None   Testing/Procedures: Your physician has recommended that you wear a 48 hour holter monitor. Holter monitors are medical devices that record the heart's electrical activity. Doctors most often use these monitors to diagnose arrhythmias. Arrhythmias are problems with the speed or rhythm of the heartbeat. The monitor is a small, portable device. You can wear one while you do your normal daily activities. This is usually used to diagnose what is causing palpitations/syncope (passing out). [place] [address]   Follow-Up: At [facility], you and your health needs are our priority.  As part of our continuing mission to provide you with exceptional heart care, we have created designated Provider Care Teams.  These Care Teams include your primary Cardiologist (physician) and Advanced Practice Providers (APPs -  Physician Assistants and Nurse Practitioners) who all work together to provide you with the care you need, when  you need it. You will need a follow up appointment in 1 years.  Please call our office 2 months in advance to schedule this appointment.  You may see Dr. Ellyn Hack or one of the following Advanced Practice Providers on your designated  Care Team:   Rosaria Ferries, PA-C . Jory Sims, DNP, ANP  Any Other Special Instructions Will Be Listed Below (If Applicable).     Studies Ordered:   Orders Placed This Encounter  Procedures  . [place] MONITOR - 48 HOUR      Glenetta Hew, M.D., M.S. Interventional Cardiologist   Pager # [phone] Phone # [phone] [address] [place], [place] [address]   Thank you for choosing Heartcare at [facility]!!

## 2018-03-29 NOTE — Patient Instructions (Signed)
Medication Instructions:  Your Physician recommend you continue on your current medication as directed.    If you need a refill on your cardiac medications before your next appointment, please call your pharmacy.   Lab work: None   Testing/Procedures: Your physician has recommended that you wear a 48 hour holter monitor. Holter monitors are medical devices that record the heart's electrical activity. Doctors most often use these monitors to diagnose arrhythmias. Arrhythmias are problems with the speed or rhythm of the heartbeat. The monitor is a small, portable device. You can wear one while you do your normal daily activities. This is usually used to diagnose what is causing palpitations/syncope (passing out). [place] [address]   Follow-Up: At [facility], you and your health needs are our priority.  As part of our continuing mission to provide you with exceptional heart care, we have created designated Provider Care Teams.  These Care Teams include your primary Cardiologist (physician) and Advanced Practice Providers (APPs -  Physician Assistants and Nurse Practitioners) who all work together to provide you with the care you need, when you need it. You will need a follow up appointment in 1 years.  Please call our office 2 months in advance to schedule this appointment.  You may see Dr. Ellyn Hack or one of the following Advanced Practice Providers on your designated Care Team:   Rosaria Ferries, PA-C . Jory Sims, DNP, ANP  Any Other Special Instructions Will Be Listed Below (If Applicable).

## 2018-03-29 NOTE — Assessment & Plan Note (Addendum)
Has had EKGs with PAC & PVCs --I guess he and the family are concerned that he may have A. fib and prep perhaps that is the cause of his stroke.-- will check 48 hr monitor to determine burden or if any arrhythmia present

## 2018-04-02 ENCOUNTER — Encounter: Payer: Self-pay | Admitting: Cardiology

## 2018-04-02 NOTE — Assessment & Plan Note (Signed)
LDL 37 with total cholesterol 89 on current dose of Crestor.  Was restarted back in the hospital at 20 mg.  At this point I think were any Catch-22 I would like to see if may be stopping his statin would help his memory issues.  We can discuss this in follow-up.

## 2018-04-02 NOTE — Assessment & Plan Note (Signed)
Has had labile blood pressures in the past.  I would prefer for him to have permissive hypertension, especially because of risk for falls.  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  Will need to contact him to have him take this 1/2 tablet twice daily.  He is also taking lisinopril 10 mg.

## 2018-04-02 NOTE — Assessment & Plan Note (Signed)
Being followed by neurology.  Would not want his blood pressure to be too high, but current levels are okay. Am not sure the benefit for statins in this situation.  May consider holding statin because of possible memory loss issues associated with it.

## 2018-04-02 NOTE — Assessment & Plan Note (Signed)
Known RCA occlusion with likely remote inferior MI.  I do not see any prior echocardiogram, but his LV gram at the time showed inferior/diaphragmatic hypokinesis.. In many years he has refused any follow-up stress testing or echoes.  As such, I do not really think we need to check 1.  Check an echocardiogram may not be a bad idea for source of stroke, but the stroke was old and therefore not sure how beneficial it would be. Simply continue aspirin and beta-blocker (although we need exchanged to correct dosing). He is on statin but I am thinking about potentially holding that to evaluate change of memory issues.

## 2018-04-05 ENCOUNTER — Other Ambulatory Visit: Payer: Self-pay | Admitting: Internal Medicine

## 2018-04-05 DIAGNOSIS — E785 Hyperlipidemia, unspecified: Secondary | ICD-10-CM

## 2018-04-05 NOTE — Telephone Encounter (Signed)
Next appt scheduled 11/4 with PCP.

## 2018-04-05 NOTE — Telephone Encounter (Signed)
Amlodipine was stopped on his last clinic visit due to labile blood pressures. Not on med list.

## 2018-04-06 ENCOUNTER — Ambulatory Visit: Payer: Medicare Other | Admitting: Neurology

## 2018-04-06 ENCOUNTER — Other Ambulatory Visit: Payer: Self-pay | Admitting: Cardiology

## 2018-04-06 ENCOUNTER — Ambulatory Visit (INDEPENDENT_AMBULATORY_CARE_PROVIDER_SITE_OTHER): Payer: Medicare Other

## 2018-04-06 DIAGNOSIS — I499 Cardiac arrhythmia, unspecified: Secondary | ICD-10-CM

## 2018-04-06 DIAGNOSIS — I493 Ventricular premature depolarization: Secondary | ICD-10-CM

## 2018-04-06 DIAGNOSIS — I491 Atrial premature depolarization: Secondary | ICD-10-CM

## 2018-04-06 DIAGNOSIS — R42 Dizziness and giddiness: Secondary | ICD-10-CM

## 2018-04-24 ENCOUNTER — Encounter: Payer: Self-pay | Admitting: Internal Medicine

## 2018-04-24 ENCOUNTER — Ambulatory Visit (INDEPENDENT_AMBULATORY_CARE_PROVIDER_SITE_OTHER): Payer: Medicare Other | Admitting: Internal Medicine

## 2018-04-24 ENCOUNTER — Other Ambulatory Visit: Payer: Self-pay

## 2018-04-24 VITALS — BP 132/82 | HR 54 | Temp 98.0°F | Ht 70.0 in | Wt 153.2 lb

## 2018-04-24 DIAGNOSIS — R519 Headache, unspecified: Secondary | ICD-10-CM

## 2018-04-24 DIAGNOSIS — F0151 Vascular dementia with behavioral disturbance: Secondary | ICD-10-CM

## 2018-04-24 DIAGNOSIS — I499 Cardiac arrhythmia, unspecified: Secondary | ICD-10-CM | POA: Diagnosis not present

## 2018-04-24 DIAGNOSIS — M542 Cervicalgia: Secondary | ICD-10-CM

## 2018-04-24 DIAGNOSIS — Z79899 Other long term (current) drug therapy: Secondary | ICD-10-CM

## 2018-04-24 DIAGNOSIS — F01518 Vascular dementia, unspecified severity, with other behavioral disturbance: Secondary | ICD-10-CM

## 2018-04-24 DIAGNOSIS — I10 Essential (primary) hypertension: Secondary | ICD-10-CM

## 2018-04-24 DIAGNOSIS — I491 Atrial premature depolarization: Secondary | ICD-10-CM

## 2018-04-24 DIAGNOSIS — I493 Ventricular premature depolarization: Secondary | ICD-10-CM

## 2018-04-24 DIAGNOSIS — R51 Headache: Secondary | ICD-10-CM | POA: Diagnosis not present

## 2018-04-24 DIAGNOSIS — F0150 Vascular dementia without behavioral disturbance: Secondary | ICD-10-CM

## 2018-04-24 MED ORDER — METOPROLOL SUCCINATE ER 50 MG PO TB24: 50.0000 mg | ORAL_TABLET | Freq: Every day | ORAL | 3 refills | Status: DC

## 2018-04-24 NOTE — Progress Notes (Signed)
.   CC: Follow-up chronic medical conditions  HPI: Mr.Laquon JACQUELINE DELAPENA is a 77 y.o. with a PMH listed below presenting for follow up of his chronic conditions.  Patient was accompanied by his wife and son. Please see A&P for status of the patient's chronic medical conditions.  Past Medical History:  Diagnosis Date  . Allergy   . CRI (chronic renal insufficiency)   . Dyslipidemia    Combined with hx of marked hypertriglyceridemia  . Hearing loss   . Hemorrhoids 1979  . Hypertension   . Inguinal hernia September 2012   Bilateral   . Myocardial infarction, old    Remote inferior MI with known occlusion of the RCA.  Medical management  . Occlusive coronary artery disease    History of remote inferior MI.  Documented CTO of RCA with left-to-right collaterals -> treated medically.  NO STENT.  . Osteoarthritis   . Psoriasis   . Skin cancer    Review of Systems: Refer to history of present illness and assessment and plans for pertinent review of systems, all others reviewed and negative.  Physical Exam:  There were no vitals filed for this visit. Physical Exam  Constitutional: He is oriented to person, place, and time.  Frail appearing, NAD, elderly male, in wheelchair  HENT:  Head: Normocephalic and atraumatic.  Eyes: Pupils are equal, round, and reactive to light. Conjunctivae and EOM are normal.  Neck: Normal range of motion. Neck supple. No thyromegaly present.  Cardiovascular: Normal rate, regular rhythm and normal heart sounds. Exam reveals no gallop and no friction rub.  No murmur heard. Pulmonary/Chest: Effort normal and breath sounds normal. No respiratory distress. He has no wheezes.  Abdominal: Soft. Bowel sounds are normal. He exhibits no distension.  Musculoskeletal: Normal range of motion.  Neurological: He is alert and oriented to person, place, and time. He displays normal reflexes. No cranial nerve deficit.  Strength: 4+/5 throughout, some imbalance in his gait    Skin: Skin is warm and dry. No erythema.  Psychiatric: Mood and affect normal.     Social History   Socioeconomic History  . Marital status: Married    Spouse name: Not on file  . Number of children: 1  . Years of education: Not on file  . Highest education level: Not on file  Occupational History  . Occupation: WELDER    Fish farm manager: [employer]  Social Needs  . Financial resource strain: Not on file  . Food insecurity:    Worry: Not on file    Inability: Not on file  . Transportation needs:    Medical: Not on file    Non-medical: Not on file  Tobacco Use  . Smoking status: Never Smoker  . Smokeless tobacco: Never Used  Substance and Sexual Activity  . Alcohol use: No  . Drug use: No  . Sexual activity: Not on file  Lifestyle  . Physical activity:    Days per week: Not on file    Minutes per session: Not on file  . Stress: Not on file  Relationships  . Social connections:    Talks on phone: Not on file    Gets together: Not on file    Attends religious service: Not on file    Active member of club or organization: Not on file    Attends meetings of clubs or organizations: Not on file    Relationship status: Not on file  . Intimate partner violence:    Fear of  current or ex partner: Not on file    Emotionally abused: Not on file    Physically abused: Not on file    Forced sexual activity: Not on file  Other Topics Concern  . Not on file  Social History Narrative  . Not on file    Family History  Problem Relation Age of Onset  . Unexplained death Mother   . Heart attack Father 63  . Prostate cancer Brother   . Skin cancer Brother     Assessment & Plan:   See Encounters Tab for problem based charting.  Patient seen with Dr. Rebeca Alert

## 2018-04-24 NOTE — Assessment & Plan Note (Signed)
Assessment: Patient denied any chest pain, palpitations, weakness, or dizziness. He is being followed by cardiology for this. Holter monitor showed PACs and PVCs with short runs of PAT and triplets of PVCs.  Cardiology does not recommend anticoagulation at this time.  They recommend only considering if he becomes symptomatic. Today his cardiac exam showed a RRR, no extra heart sounds, normal S1, and S2.   Plan:  -Continue to monitor for now, no symptoms at this time -Follow up with cardiology

## 2018-04-24 NOTE — Assessment & Plan Note (Signed)
Assessment: Today patient's blood pressure is 132/82.  Per cardiology goal blood pressure of less than 160.  He is currently taking metoprolol tartrate 50 mg daily and lisinopril 10 mg daily. He does report that he is taking the metoprolol only once a day. Discussed that we will switch to a longer acting metoprolol.   Plan:  -Switch metoprolol tartrate to XL 50 mg daily -Continue lisinopril 10 mg

## 2018-04-24 NOTE — Assessment & Plan Note (Signed)
Assessment: Patient reports that his neck pain still occurs but that it is well controlled with applying heat to it. He denies any new symptoms.   Plan: -Continue current regimen

## 2018-04-24 NOTE — Assessment & Plan Note (Addendum)
Assessment:  Family reports that he is doing better but that he has good days and bad days. They report that the bad days are when he is very irritable and has more confusion. He was alert and oriented today. He was started on sertraline on his last visit and the family reports that this has significantly improved his irritability and mood. He also has been taking melatonin to help him sleep and they report that this really helps. He is currently being followed by neurology. Neuropathy panel that was unremarkable EEG showed mild generalized slowing of brain activity with no epileptiform activity maneuvers and findings.  He was started on 1 mg Folic Acid which he reports that he has been taking. The wife and son report that this seems to be helping him. Overall the family and patient report that they feel like he is doing well. He has follow up with neurology next month.  Family reports that he has difficulty walking long distances and that he becomes very weak. They report that he uses a walker to get around but that he still bet very weak with using that. It seems reasonable that he gets a wheelchair to help him get around and prevent any future falls.   Patient suffers from vascular dementia which impairs their ability to perform daily activities like grooming in the home. A walker will not resolve issue with performing activities of daily living. A wheelchair will allow patient to safely perform daily activities. Patient can safely propel the wheelchair in the home or has a caregiver who can provide assistance. Accessories: elevating leg rests (ELRs), wheel locks, extensions and anti-tippers.  Plan: -Continue sertraline 25 mg daily -Continue melatonin  -Continue to follow up with neurology -Referral for a wheelchair.

## 2018-04-24 NOTE — Patient Instructions (Signed)
[name] Brennan,   Thank you for allowing [place] to provide your care today. It was a pleasure to see you again.Today we discussed your chronic medical conditions.     Today we have changed your metoprolol 50 mg to metoprolol XL 50 mg daily, this is a longer acting medication.    We are trying to get you a wheelchair for your home.  Please continue your current medications.  Please follow-up in 3-4 months.    Should you have any questions or concerns please call the internal medicine clinic at [phone].

## 2018-04-25 ENCOUNTER — Telehealth: Payer: Self-pay | Admitting: *Deleted

## 2018-04-25 NOTE — Telephone Encounter (Signed)
Spoke to patient's wife. Result given . Verbalized understanding  wife states  Primary switch to long acting metoprolol succinate 50 mg daily on 04/24/18

## 2018-04-25 NOTE — Telephone Encounter (Signed)
  Patient's wife Vaughan Basta is returning call

## 2018-04-25 NOTE — Telephone Encounter (Signed)
Left message to call back - in regards to holter monitor - f/u appt 1 year (2020)

## 2018-04-25 NOTE — Telephone Encounter (Signed)
-----   Message from Leonie Man, MD sent at 04/23/2018  2:35 PM EST ----- He indeed does have PACs and PVCs with short runs of PAT and triplets of PVCs.  Long run of PAT could be a harbinger of A. Fib.  For now, I would not recommend full antic regulation for A. fib, but low threshold to consider he has a repeat TIA or stroke.  If symptomatic, we can discuss treating, but I would only treat if symptomatic   Glenetta Hew, MD

## 2018-04-27 DIAGNOSIS — H04123 Dry eye syndrome of bilateral lacrimal glands: Secondary | ICD-10-CM | POA: Diagnosis not present

## 2018-04-27 DIAGNOSIS — H40012 Open angle with borderline findings, low risk, left eye: Secondary | ICD-10-CM | POA: Diagnosis not present

## 2018-04-27 DIAGNOSIS — H5213 Myopia, bilateral: Secondary | ICD-10-CM | POA: Diagnosis not present

## 2018-04-30 NOTE — Progress Notes (Signed)
Internal Medicine Clinic Attending  I saw and evaluated the patient.  I personally confirmed the key portions of the history and exam documented by Dr. Krienke and I reviewed pertinent patient test results.  The assessment, diagnosis, and plan were formulated together and I agree with the documentation in the resident's note.  Alexander Raines, M.D., Ph.D.  

## 2018-05-03 DIAGNOSIS — R269 Unspecified abnormalities of gait and mobility: Secondary | ICD-10-CM | POA: Diagnosis not present

## 2018-05-08 ENCOUNTER — Ambulatory Visit: Payer: Medicare Other | Admitting: Neurology

## 2018-05-08 ENCOUNTER — Other Ambulatory Visit: Payer: Self-pay | Admitting: Internal Medicine

## 2018-05-08 ENCOUNTER — Encounter: Payer: Self-pay | Admitting: Neurology

## 2018-05-08 ENCOUNTER — Telehealth: Payer: Self-pay | Admitting: Neurology

## 2018-05-08 VITALS — BP 137/60 | HR 70 | Ht 70.0 in | Wt 152.0 lb

## 2018-05-08 DIAGNOSIS — R634 Abnormal weight loss: Secondary | ICD-10-CM | POA: Diagnosis not present

## 2018-05-08 DIAGNOSIS — G3184 Mild cognitive impairment, so stated: Secondary | ICD-10-CM

## 2018-05-08 NOTE — Patient Instructions (Signed)
I had a long discussion with the patient and his wife regarding his multifocal symptoms of poor taste and appetite, unexplained weight loss, memory loss and gait ataxia being of unclear significance.  I recommend further work-up with checking paraneoplastic panel, EMG nerve conduction study, repeat homocystine levels and checking CT scan of the abdomen and pelvis for malignancy.  Continue folic acid for elevated homocystine.  He was advised to use a cane or a walker and we discussed fall and safety prevention precautions.  He will return for follow-up in 3 months or call earlier if necessary.  Fall Prevention in the Home Falls can cause injuries. They can happen to people of all ages. There are many things you can do to make your home safe and to help prevent falls. What can I do on the outside of my home?  Regularly fix the edges of walkways and driveways and fix any cracks.  Remove anything that might make you trip as you walk through a door, such as a raised step or threshold.  Trim any bushes or trees on the path to your home.  Use bright outdoor lighting.  Clear any walking paths of anything that might make someone trip, such as rocks or tools.  Regularly check to see if handrails are loose or broken. Make sure that both sides of any steps have handrails.  Any raised decks and porches should have guardrails on the edges.  Have any leaves, snow, or ice cleared regularly.  Use sand or salt on walking paths during winter.  Clean up any spills in your garage right away. This includes oil or grease spills. What can I do in the bathroom?  Use night lights.  Install grab bars by the toilet and in the tub and shower. Do not use towel bars as grab bars.  Use non-skid mats or decals in the tub or shower.  If you need to sit down in the shower, use a plastic, non-slip stool.  Keep the floor dry. Clean up any water that spills on the floor as soon as it happens.  Remove soap buildup in  the tub or shower regularly.  Attach bath mats securely with double-sided non-slip rug tape.  Do not have throw rugs and other things on the floor that can make you trip. What can I do in the bedroom?  Use night lights.  Make sure that you have a light by your bed that is easy to reach.  Do not use any sheets or blankets that are too big for your bed. They should not hang down onto the floor.  Have a firm chair that has side arms. You can use this for support while you get dressed.  Do not have throw rugs and other things on the floor that can make you trip. What can I do in the kitchen?  Clean up any spills right away.  Avoid walking on wet floors.  Keep items that you use a lot in easy-to-reach places.  If you need to reach something above you, use a strong step stool that has a grab bar.  Keep electrical cords out of the way.  Do not use floor polish or wax that makes floors slippery. If you must use wax, use non-skid floor wax.  Do not have throw rugs and other things on the floor that can make you trip. What can I do with my stairs?  Do not leave any items on the stairs.  Make sure that there are handrails  on both sides of the stairs and use them. Fix handrails that are broken or loose. Make sure that handrails are as long as the stairways.  Check any carpeting to make sure that it is firmly attached to the stairs. Fix any carpet that is loose or worn.  Avoid having throw rugs at the top or bottom of the stairs. If you do have throw rugs, attach them to the floor with carpet tape.  Make sure that you have a light switch at the top of the stairs and the bottom of the stairs. If you do not have them, ask someone to add them for you. What else can I do to help prevent falls?  Wear shoes that: ? Do not have high heels. ? Have rubber bottoms. ? Are comfortable and fit you well. ? Are closed at the toe. Do not wear sandals.  If you use a stepladder: ? Make sure that  it is fully opened. Do not climb a closed stepladder. ? Make sure that both sides of the stepladder are locked into place. ? Ask someone to hold it for you, if possible.  Clearly mark and make sure that you can see: ? Any grab bars or handrails. ? First and last steps. ? Where the edge of each step is.  Use tools that help you move around (mobility aids) if they are needed. These include: ? Canes. ? Walkers. ? Scooters. ? Crutches.  Turn on the lights when you go into a dark area. Replace any light bulbs as soon as they burn out.  Set up your furniture so you have a clear path. Avoid moving your furniture around.  If any of your floors are uneven, fix them.  If there are any pets around you, be aware of where they are.  Review your medicines with your doctor. Some medicines can make you feel dizzy. This can increase your chance of falling. Ask your doctor what other things that you can do to help prevent falls. This information is not intended to replace advice given to you by your health care provider. Make sure you discuss any questions you have with your health care provider. Document Released: 04/03/2009 Document Revised: 11/13/2015 Document Reviewed: 07/12/2014 Elsevier Interactive Patient Education  [employer].

## 2018-05-08 NOTE — Telephone Encounter (Signed)
[employer] medicare order sent to [facility]. No auth they will reach out to the pt to schedule.  °

## 2018-05-08 NOTE — Telephone Encounter (Signed)
Patient wife is aware and I gave her GI phone number of [phone] and to give them a call if she has not heard in the next 2-3 business days.

## 2018-05-08 NOTE — Progress Notes (Signed)
[facility] [address] [place]. [place] [address] [phone]       OFFICE CONSULT NOTE  Mr. [name] Brennan Date of Birth:  February 19, 1941 Medical Record Number:  [id number]   Referring MD: Lars Mage  Reason for Referral:  Memory loss and dementia  HPI: Initial visit 03/02/2018 [name] Brennan is a 77 year old male seen today for initial office consultation visit for dementia.he is accompanied  by his wife and son who provide most of the history.I have reviewed electronic medical records and personally reviewed imaging films. The patient presented in August for admission to [facility] with few weeks history of generalized weakness, weight loss And poor appetite and some intermittent confusion. He had been having some sinus infections at that time and was treated with several courses of antibiotics. 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 The patient and family were unaware that he had any focal clinical symptoms compatible with a stroke. Lab work during the hospitalization included in women A1c, B12, TSH and lipid profile, HIV and RPR were all normal. Patient was diagnosed with vascular dementia. No inpatient neurology consultation was made. Patient had oral thrush and it was presumed that a lot of his poor eating was related to that. However that has been treated in the family now feels that he is eating little better but still not eating enough. Patient actually has had some memory impairment even going back to a year or so but in recent months the family acknowledges that it has gotten worse. The patient has also had problems with walking and balance is at frequent falls and he has had at least 5 falls in the last few months.patient denies any pain in his leg, tingling, numbness. He states that he has no warning. He does not lose consciousness. He feels wobbly all the time when walking. On Mini-Mental status exam  testing today scored 18/30 with deficits in orientation, attention, recall and groin. He was unable to copy intersecting pentagons. He was able to name only 9 animals with full legs. Clock drawing score was 1/4. Update 05/09/18 ; He returns for follow-up after last visit 2 months ago.  He is accompanied by his wife.  He states he has noticed some improvement in his memory and cognitive difficulties after starting folic acid replacement for his elevated homocystine.  He had lab work done on 10 03/02/2018 which showed elevated homocystine of 23.3.  Vitamin B12 and TSH were normal.  Methylmalonic acid was also normal.  EEG done on 03/22/2018 showed mild generalized slowing no epileptiform activity.  EMG nerve conduction study was scheduled but the patient left as it took too long for him his appointment and he wanted to reschedule.  MRI scan of the brain on 03/21/2018 shows old right frontal and right parietal infarcts of remote age.  No acute abnormality.  MRA of the brain was unremarkable.  Patient continues to have gait and balance difficulties.  He can manage inside his house by holding onto objects.  He has had one fall outside a week ago but fortunately had no major injuries.  He has recently started using a walker for long distance and outdoors.  He showed improvement on cognitive testing today and scored 4/4 on clock drawing and 1/3 on recall and was able to name 9 animals. ROS:   14 system review of systems is positive for  Fatigue, weight loss, hearing loss,  change in appetite,  memory loss, confusion, weakness, restless leg, gait imbalance and all systems negative  PMH:  Past Medical History:  Diagnosis Date  . Allergy   . CRI (chronic renal insufficiency)   . Dyslipidemia    Combined with hx of marked hypertriglyceridemia  . Hearing loss   . Hemorrhoids 1979  . Hypertension   . Inguinal hernia September 2012   Bilateral   . Myocardial infarction, old    Remote inferior MI with known occlusion  of the RCA.  Medical management  . Occlusive coronary artery disease    History of remote inferior MI.  Documented CTO of RCA with left-to-right collaterals -> treated medically.  NO STENT.  . Osteoarthritis   . Psoriasis   . Skin cancer     Social History:  Social History   Socioeconomic History  . Marital status: Married    Spouse name: Not on file  . Number of children: 1  . Years of education: Not on file  . Highest education level: Not on file  Occupational History  . Occupation: WELDER    Fish farm manager: [employer]  Social Needs  . Financial resource strain: Not on file  . Food insecurity:    Worry: Not on file    Inability: Not on file  . Transportation needs:    Medical: Not on file    Non-medical: Not on file  Tobacco Use  . Smoking status: Never Smoker  . Smokeless tobacco: Never Used  Substance and Sexual Activity  . Alcohol use: No  . Drug use: No  . Sexual activity: Not on file  Lifestyle  . Physical activity:    Days per week: Not on file    Minutes per session: Not on file  . Stress: Not on file  Relationships  . Social connections:    Talks on phone: Not on file    Gets together: Not on file    Attends religious service: Not on file    Active member of club or organization: Not on file    Attends meetings of clubs or organizations: Not on file    Relationship status: Not on file  . Intimate partner violence:    Fear of current or ex partner: Not on file    Emotionally abused: Not on file    Physically abused: Not on file    Forced sexual activity: Not on file  Other Topics Concern  . Not on file  Social History Narrative  . Not on file    Medications:   Current Outpatient Medications on File Prior to Visit  Medication Sig Dispense Refill  . aspirin 81 MG tablet Take 81 mg by mouth daily.      . fenofibrate 160 MG tablet TAKE 1 TABLET BY MOUTH EVERY DAY 90 tablet 1  . folic acid (FOLVITE) 1 MG tablet Take 1 tablet (1 mg total) by  mouth daily. 90 tablet 3  . GuaiFENesin (MUCUS RELIEF ADULT PO) Take by mouth as needed.     Marland Kitchen lisinopril (PRINIVIL,ZESTRIL) 10 MG tablet Take 1 tablet (10 mg total) by mouth daily. 180 tablet 2  . Melatonin 5 MG TABS Take 1-2 tablets (5-10 mg total) by mouth at bedtime as needed. 60 tablet 1  . metoprolol succinate (TOPROL-XL) 50 MG 24 hr tablet Take 1 tablet (50 mg total) by mouth daily. Take with or immediately following a meal. 90 tablet 3  . rosuvastatin (CRESTOR) 20 MG tablet Take 20 mg by mouth daily.    Marland Kitchen  sennosides-docusate sodium (SENOKOT-S) 8.6-50 MG tablet Take 1 tablet by mouth 2 (two) times daily.    . sertraline (ZOLOFT) 25 MG tablet TAKE 1 TABLET BY MOUTH EVERY DAY 90 tablet 1  . triamcinolone (KENALOG) 0.1 % cream Apply 1 application topically daily as needed (rash).     Marland Kitchen amLODipine (NORVASC) 5 MG tablet      No current facility-administered medications on file prior to visit.     Allergies:   Allergies  Allergen Reactions  . Amoxicillin Swelling  . Atorvastatin Swelling  . Avalide [Irbesartan-Hydrochlorothiazide]     Apparently Caused an increase in Creatinine  . Cymbalta [Duloxetine Hcl]     Anaphylactic    . Escitalopram Oxalate     Hives  . Fenofibrate     Intolerant     Physical Exam General: frail cachectic elderly male seated, in no evident distress Head: head normocephalic and atraumatic.   Neck: supple with no carotid or supraclavicular bruits Cardiovascular: regular rate and rhythm, soft ejection systolic murmur over precordium. Musculoskeletal: no deformity Skin:  no rash/petichiae Vascular:  Normal pulses all extremities  Neurologic Exam Mental Status: Awake and fully alert. Oriented to place and time. Recent and remote memory intact. Attention span, concentration and fund of knowledge appropriate. Mood and affect appropriate. Mini-Mental status exam testing not done today.Marland Kitchen He was unable to copy intersecting pentagons. He was able to name only 9  animals with full legs. Clock drawing score was 144. Cranial Nerves: Fundoscopic exam reveals sharp disc margins. Pupils equal, briskly reactive to light. Extraocular movements full without nystagmus. Visual fields full to confrontation. Hearing intact. Facial sensation intact. Face, tongue, palate moves normally and symmetrically.  Motor: reduced bulk but normaltone. Normal strength in all tested extremity muscles. Sensory.: intact to touch , pinprick , position but diminished vibratory sensationover toes bilaterally..  Coordination: Rapid alternating movements normal in all extremities. Finger-to-nose and heel-to-shin performed accurately bilaterally.positive Romberg sign. Unsteady on a narrow base Gait and Station: Arises from chair with  difficulty. Stance is slightly stooped Gait wide-based and ataxic. Marland Kitchen Unable to heel, toe and tandem walk   Reflexes: 1+ and symmetricexcept both knee jerks are depressed and ankle jerks are absent.. Toes downgoing.        ASSESSMENT: 77 year old male with subacute decline in gait balance and memory loss of unclear etiology.  Possibly paraneoplastic given multifocal complaints of poor appetite, altered taste, significant weight loss.  Work-up for underlying malignancy would be beneficial.  His cognitive complaints appear to have improved after replacement with folic acid     PLAN: I had a long discussion with the patient and his wife regarding his multifocal symptoms of poor taste and appetite, unexplained weight loss, memory loss and gait ataxia being of unclear significance.  I recommend further work-up with checking paraneoplastic panel, EMG nerve conduction study, repeat homocystine levels and checking CT scan of the abdomen and pelvis for malignancy.  Continue folic acid for elevated homocystine.  He was advised to use a cane or a walker and we discussed fall and safety prevention precautions.  He will return for follow-up in 3 months or call earlier if  necessary.greater than 50% time during this 25 minute  visit was spent on counseling and coordination of care about his subacute memory loss and cognitive impairment as well as worsening gait and balance difficulties and answered questionsHe will return for follow-up in 2 months or call earlier if necessary. Antony Contras, MD  [facility] [address]  [place], [place] [address]  Phone [phone] Fax [phone] Note: This document was prepared with digital dictation and possible smart phrase technology. Any transcriptional errors that result from this process are unintentional.

## 2018-05-10 ENCOUNTER — Other Ambulatory Visit: Payer: Self-pay | Admitting: Internal Medicine

## 2018-05-10 NOTE — Telephone Encounter (Signed)
Medication is not on current med list.

## 2018-05-11 DIAGNOSIS — H04122 Dry eye syndrome of left lacrimal gland: Secondary | ICD-10-CM | POA: Diagnosis not present

## 2018-05-11 LAB — PARANEOPLASTIC PROFILE 1: Neuronal Nuc Ab (Ri), IFA: <1:10 {titer}

## 2018-05-11 LAB — HOMOCYSTEINE: Homocysteine: 24.7 umol/L — ABNORMAL HIGH (ref 0.0–15.0)

## 2018-05-15 NOTE — Telephone Encounter (Signed)
No this should not be refilled. I will refuse it. Thank you!

## 2018-05-15 NOTE — Telephone Encounter (Signed)
Dr Sherry Ruffing should this be refilled ; if not, please refuse. Thnaks

## 2018-05-17 ENCOUNTER — Other Ambulatory Visit: Payer: Self-pay

## 2018-05-17 ENCOUNTER — Telehealth: Payer: Self-pay | Admitting: Neurology

## 2018-05-17 NOTE — Telephone Encounter (Signed)
Hildred Alamin is calling from  [place] [phone].  Patient is coming in Monday for CT. Per Hildred Alamin they always do  CT ABDOMEN PELVIS W WO CONTRAST .   (Just with Contrast only ) . Order will need to be changed and call [facility] back . [facility] closed Thursday and Friday.

## 2018-05-17 NOTE — Telephone Encounter (Signed)
Rn call [name] Brennan at [facility] at [phone]. [name] Brennan stated the order needs to be change to CT abdomen pelvis with contrast or Ct abdomen pelvis without contrast. They stated being that pt is over 65 they will need a creatine and bun order checking for kidney function. [name] Brennan stated looking pts history it seems he has some chronc renal insufficiency. RN stated message will be sent to Dr. Leonie Man for review.

## 2018-05-17 NOTE — Patient Outreach (Signed)
[facility]) Care Management  05/17/2018  [name] Brennan 14-Jul-1940 [id number]   Medication Adherence call to [name] Brennan spoke with patient's wife patient was taking 1/2 tablet at one time but, now he is taking a full tablet of Rosuvastatin 20 mg he has plenty at this time and won't need any until January/2020. [name] Brennan is showing past due under [place].    [facility] Management Direct Dial [phone]  Fax [phone] [email or web address]

## 2018-05-18 ENCOUNTER — Other Ambulatory Visit: Payer: Self-pay | Admitting: Neurology

## 2018-05-18 DIAGNOSIS — N289 Disorder of kidney and ureter, unspecified: Secondary | ICD-10-CM

## 2018-05-18 NOTE — Telephone Encounter (Signed)
Ok will order BUN and creatinine

## 2018-05-22 ENCOUNTER — Telehealth: Payer: Self-pay | Admitting: Neurology

## 2018-05-22 ENCOUNTER — Other Ambulatory Visit: Payer: Medicare Other

## 2018-05-22 ENCOUNTER — Other Ambulatory Visit: Payer: Self-pay | Admitting: Neurology

## 2018-05-22 ENCOUNTER — Other Ambulatory Visit: Payer: Self-pay

## 2018-05-22 DIAGNOSIS — G3184 Mild cognitive impairment, so stated: Secondary | ICD-10-CM

## 2018-05-22 DIAGNOSIS — K769 Liver disease, unspecified: Secondary | ICD-10-CM

## 2018-05-22 NOTE — Telephone Encounter (Signed)
Ok will change order to CT abd/pelvis with contrast only

## 2018-05-22 NOTE — Telephone Encounter (Signed)
[employer] medicare order sent to [facility]. No auth they will reach out to the pt to schedule.  °

## 2018-05-24 ENCOUNTER — Other Ambulatory Visit: Payer: Self-pay | Admitting: Neurology

## 2018-05-24 MED ORDER — CEREFOLIN 6-1-50-5 MG PO TABS: 1.0000 | ORAL_TABLET | ORAL | 3 refills | Status: AC

## 2018-06-01 ENCOUNTER — Telehealth: Payer: Self-pay

## 2018-06-01 ENCOUNTER — Telehealth: Payer: Self-pay | Admitting: Neurology

## 2018-06-01 NOTE — Telephone Encounter (Signed)
Rn call patients wife about lab work and if they wanted to start cerefolin for memory. The wife stated she does not know about starting a new medication. Her husband is getting weaker, lost weight, and is getting agitation at times. She had to cancel the EMG and CT abdomen because its hard to get him ready and to appts at times for testing. She wanted DR. Sethi to be aware. Rn recommend the wife call for EMG and Ct abdomen to reschedule test when her husband is not weaker. Rn also advised if she is able to care for him and he is still getting weaker to seek nearest ED. The wife verbalized understanding.

## 2018-06-01 NOTE — Telephone Encounter (Signed)
Pt wife(on DPR-Azzaro,Linda S @[phone]) has called RN Katrina back, she is asking for a returned call

## 2018-06-01 NOTE — Telephone Encounter (Signed)
LEft vm for patients wife to call back about cerefolin that was sent in by Dr. Leonie Man and for pt to continue folic acid daily due to homocystine levels.

## 2018-06-01 NOTE — Telephone Encounter (Signed)
Braxton Feathers M 52 minutes ago (9:47 AM)      Pt wife(on DPR-[place],Linda S @[phone]) has called RN Lorna Strother back, she is asking for a returned call

## 2018-06-01 NOTE — Telephone Encounter (Signed)
Rn call wife to state pt should continue the folic acid pill daily due to the levels of the homocystine levels. PTs wife verbalized understanding. ------

## 2018-06-01 NOTE — Telephone Encounter (Signed)
-----   Message from Garvin Fila, MD sent at 05/15/2018  6:00 PM EST ----- Kindly inform the patient that paraneoplastic panel blood work was negative. Her homocystine level is still high and she needs to continue to take folic acid. If she can afford to pay for $50 a month out of pocket I can prescribe Cerefolin: NAC which is not covered by insurance

## 2018-06-02 DIAGNOSIS — R269 Unspecified abnormalities of gait and mobility: Secondary | ICD-10-CM | POA: Diagnosis not present

## 2018-06-05 ENCOUNTER — Encounter: Payer: Medicare Other | Admitting: Neurology

## 2018-06-07 ENCOUNTER — Other Ambulatory Visit: Payer: Self-pay

## 2018-06-07 MED ORDER — CEREFOLIN NAC 6-2-600 MG PO TABS: 1.0000 | ORAL_TABLET | Freq: Every day | ORAL | 3 refills | Status: AC

## 2018-06-07 NOTE — Telephone Encounter (Signed)
Rn call patients wife back about wanting to start the cerefolin per Dr Leonie Man advice. Rn gave the wife to call [facility] for shipment and payment. Wife ask how much will it cost. Rn stated she will have to call [facility] once they process it. Rn stated the medications are deliver to the house. Rn gave her the toll free number. Verbal order was given from Dr. Leonie Man earlier in the am.

## 2018-06-07 NOTE — Telephone Encounter (Signed)
Pts wife requesting a call to further discuss starting cerefolin

## 2018-06-23 ENCOUNTER — Other Ambulatory Visit: Payer: Self-pay | Admitting: Internal Medicine

## 2018-07-03 DIAGNOSIS — R269 Unspecified abnormalities of gait and mobility: Secondary | ICD-10-CM | POA: Diagnosis not present

## 2018-07-05 ENCOUNTER — Other Ambulatory Visit: Payer: Self-pay | Admitting: Internal Medicine

## 2018-07-21 ENCOUNTER — Telehealth: Payer: Self-pay

## 2018-07-21 NOTE — Telephone Encounter (Signed)
Returned call to patient's wife. States it took him all day to have a BM as it "gets hung up." Wife had been giving patient senokot BID and additional stool softener and patient was having fecal incontinence. She then stopped both. Encouraged her to resume senokot once daily to begin with and increase fluid intake. Patient with poor appetite but is supplementing with Boost. Discussed increasing water intake. May try infusing with fruit or vegetable to improve taste. Wife asked about fruit juice. Encouraged to dilute with water to avoid high sugar content. Wife very appreciative. Confirmed appt in [facility] for 07/27/2018 at 1:45. Hubbard Hartshorn, RN, BSN

## 2018-07-21 NOTE — Telephone Encounter (Signed)
Pt's wife requesting to speak with a nurse about constipation problem. Please call back.

## 2018-07-27 ENCOUNTER — Ambulatory Visit: Payer: Medicare Other

## 2018-08-03 ENCOUNTER — Other Ambulatory Visit: Payer: Self-pay

## 2018-08-03 ENCOUNTER — Ambulatory Visit (INDEPENDENT_AMBULATORY_CARE_PROVIDER_SITE_OTHER): Payer: Medicare Other | Admitting: Internal Medicine

## 2018-08-03 ENCOUNTER — Ambulatory Visit: Payer: Medicare Other

## 2018-08-03 ENCOUNTER — Encounter: Payer: Self-pay | Admitting: Internal Medicine

## 2018-08-03 VITALS — BP 90/57 | HR 56 | Temp 98.3°F | Ht 70.0 in | Wt 133.7 lb

## 2018-08-03 DIAGNOSIS — E785 Hyperlipidemia, unspecified: Secondary | ICD-10-CM

## 2018-08-03 DIAGNOSIS — F0150 Vascular dementia without behavioral disturbance: Secondary | ICD-10-CM

## 2018-08-03 DIAGNOSIS — Z681 Body mass index (BMI) 19 or less, adult: Secondary | ICD-10-CM

## 2018-08-03 DIAGNOSIS — R269 Unspecified abnormalities of gait and mobility: Secondary | ICD-10-CM | POA: Diagnosis not present

## 2018-08-03 DIAGNOSIS — R634 Abnormal weight loss: Secondary | ICD-10-CM | POA: Diagnosis not present

## 2018-08-03 DIAGNOSIS — Z79899 Other long term (current) drug therapy: Secondary | ICD-10-CM

## 2018-08-03 DIAGNOSIS — R531 Weakness: Secondary | ICD-10-CM

## 2018-08-03 DIAGNOSIS — I2510 Atherosclerotic heart disease of native coronary artery without angina pectoris: Secondary | ICD-10-CM

## 2018-08-03 DIAGNOSIS — I10 Essential (primary) hypertension: Secondary | ICD-10-CM | POA: Diagnosis not present

## 2018-08-03 MED ORDER — METOPROLOL SUCCINATE ER 25 MG PO TB24: 25.0000 mg | ORAL_TABLET | Freq: Every day | ORAL | 3 refills | Status: DC

## 2018-08-03 NOTE — Assessment & Plan Note (Signed)
Assessment/plan: He continues to have hypotension with use of metoprolol 50 mg XL.  We will decrease his dose of metoprolol to 25 mg XL once a day.  We will hold off on adding back lisinopril at this time.

## 2018-08-03 NOTE — Assessment & Plan Note (Addendum)
Assessment/plan: [name] Brennan has had a significant unintentional weight loss of 50 pounds over the last 8 months.  His symptoms are accompanied by increasing memory loss and generalized weakness.  Unclear etiology at this point.  Will get CMP, CBC with differential, and TSH today.  HIV, chest x-ray, A1c all within normal limits (performed within the last 6 months).  Can defer repeating these tests at this time.  We will have him follow-up with his primary care doctor ASAP to go over goals of care and assess mental cognition.  Addendum:  Labs are all normal (CMP, CBC, TSH). His symptoms are likely due to his underlying dementia. He has an upcoming appointment with his PCP to undergo a formal cognitive evaluation and goals of care.

## 2018-08-03 NOTE — Progress Notes (Signed)
CC: unintentional weight loss and for follow-up of blood pressure control  HPI:  Mr.Bright SANAD FEARNOW is a 78 y.o. male with history of vascular dementia, CAD, and HLD who presents with unintentional weight loss and for follow-up of blood pressure control.  Unintentional weight loss: Mr. Yin is accompanied by his wife and son who gives much of the history.  He began to have low appetite over the past 9 months.  He states that all of the food tastes very salty and unappetizing.  Additionally his family has noted that he has had increasing memory loss over the past several months and generalized weakness.  He denies any other systemic symptoms including fever, shortness of breath, cough, chest pain, leg swelling, abdominal pain, changes in bowel movements, hematochezia, and melena.  Per chart review he has lost approximately 50 pounds since June 2019 (8 months ago).  He is followed by Dr. Leonie Man (neurologist) for stroke follow-up.  In November 2019 Dr. Leonie Man began to work-up his poor appetite, unexplained weight loss, memory loss.  This included a paraneoplastic panel and homocysteine levels which were all negative/within normal limits.  Mini-Mental status exam was 18/30 with deficits in orientation, attention, recall. He was started on folic acid.  Additionally Dr. Leonie Man recommended an abdominal CT which the patient did not get.  Today Mr. Rendleman is down 19 pounds since we last saw him in November 2019 (3 months ago).  His family states that he continues to have worsening appetite and generalized weakness.  Hypertension: He was directed to take metoprolol 50 mg XL (which from regular release formula 3 months ago) and lisinopril 10 mg daily.  His wife has been checking his blood pressure over the last 4 weeks and has noticed that he has had several episodes of hypotension with SBP from 90-120.  She stopped giving him his lisinopril the last 4 weeks.  Today his blood pressure is low at 90/57.  He does not have  any symptoms including dizziness or lightheadedness.  Past Medical History:  Diagnosis Date  . Allergy   . CRI (chronic renal insufficiency)   . Dyslipidemia    Combined with hx of marked hypertriglyceridemia  . Hearing loss   . Hemorrhoids 1979  . Hypertension   . Inguinal hernia September 2012   Bilateral   . Myocardial infarction, old    Remote inferior MI with known occlusion of the RCA.  Medical management  . Occlusive coronary artery disease    History of remote inferior MI.  Documented CTO of RCA with left-to-right collaterals -> treated medically.  NO STENT.  . Osteoarthritis   . Psoriasis   . Skin cancer    Review of Systems:   Review of Systems  Constitutional: Positive for weight loss. Negative for chills, fever and malaise/fatigue.  Respiratory: Negative for cough, hemoptysis and shortness of breath.   Cardiovascular: Negative for chest pain, palpitations and leg swelling.  Gastrointestinal: Negative for abdominal pain, blood in stool, constipation, diarrhea, melena, nausea and vomiting.  Genitourinary: Negative for dysuria, frequency, hematuria and urgency.  Neurological: Positive for weakness. Negative for dizziness, speech change, focal weakness and headaches.  All other systems reviewed and are negative.   Physical Exam:  Vitals:   08/03/18 1454  BP: (!) 90/57  Pulse: (!) 56  Temp: 98.3 F (36.8 C)  TempSrc: Oral  SpO2: 100%  Weight: 133 lb 11.2 oz (60.6 kg)  Height: 5\' 10"  (1.778 m)   Physical Exam Vitals signs reviewed.  Constitutional:  Appearance: Normal appearance.  HENT:     Head: Normocephalic and atraumatic.     Nose: No congestion or rhinorrhea.  Cardiovascular:     Rate and Rhythm: Normal rate and regular rhythm.  Pulmonary:     Effort: Pulmonary effort is normal. No respiratory distress.     Breath sounds: Normal breath sounds.  Abdominal:     General: Abdomen is flat. Bowel sounds are normal. There is no distension.      Palpations: Abdomen is soft.     Tenderness: There is no abdominal tenderness.  Musculoskeletal:     Right lower leg: No edema.     Left lower leg: No edema.  Skin:    General: Skin is warm and dry.  Neurological:     Mental Status: He is alert and oriented to person, place, and time. Mental status is at baseline.  Psychiatric:        Mood and Affect: Mood normal.        Behavior: Behavior normal.     Assessment & Plan:   See Encounters Tab for problem based charting.  Patient discussed with Dr. Lynnae January

## 2018-08-03 NOTE — Patient Instructions (Signed)
Please stop taking Lisinopril.  Please start taking the new dose of metoprolol 25 mg XL once a day. We will have you follow-up in 2 weeks to have blood pressure check-up and see how your memory/weight is doing.

## 2018-08-04 LAB — CBC WITH DIFFERENTIAL/PLATELET
Basophils Absolute: 0.1 10*3/uL (ref 0.0–0.2)
Basos: 1 %
EOS (ABSOLUTE): 0.1 10*3/uL (ref 0.0–0.4)
Eos: 1 %
Hematocrit: 42.5 % (ref 37.5–51.0)
Hemoglobin: 14.5 g/dL (ref 13.0–17.7)
Immature Grans (Abs): 0.1 10*3/uL (ref 0.0–0.1)
Immature Granulocytes: 1 %
Lymphocytes Absolute: 1.5 10*3/uL (ref 0.7–3.1)
Lymphs: 19 %
MCH: 30.8 pg (ref 26.6–33.0)
MCHC: 34.1 g/dL (ref 31.5–35.7)
MCV: 90 fL (ref 79–97)
Monocytes Absolute: 0.8 10*3/uL (ref 0.1–0.9)
Monocytes: 11 %
Neutrophils Absolute: 5.2 10*3/uL (ref 1.4–7.0)
Neutrophils: 67 %
Platelets: 244 10*3/uL (ref 150–450)
RBC: 4.71 x10E6/uL (ref 4.14–5.80)
RDW: 14 % (ref 11.6–15.4)
WBC: 7.7 10*3/uL (ref 3.4–10.8)

## 2018-08-04 LAB — CMP14 + ANION GAP
ALT: 8 IU/L (ref 0–44)
AST: 23 IU/L (ref 0–40)
Albumin/Globulin Ratio: 1.7 (ref 1.2–2.2)
Albumin: 4.5 g/dL (ref 3.7–4.7)
Alkaline Phosphatase: 112 IU/L (ref 39–117)
Anion Gap: 17 mmol/L (ref 10.0–18.0)
BILIRUBIN TOTAL: 1 mg/dL (ref 0.0–1.2)
BUN/Creatinine Ratio: 23 (ref 10–24)
BUN: 25 mg/dL (ref 8–27)
CO2: 20 mmol/L (ref 20–29)
Calcium: 10.3 mg/dL — ABNORMAL HIGH (ref 8.6–10.2)
Chloride: 101 mmol/L (ref 96–106)
Creatinine, Ser: 1.08 mg/dL (ref 0.76–1.27)
GFR calc Af Amer: 76 mL/min/{1.73_m2} (ref >59)
GFR calc non Af Amer: 65 mL/min/{1.73_m2} (ref >59)
Globulin, Total: 2.6 g/dL (ref 1.5–4.5)
Glucose: 127 mg/dL — ABNORMAL HIGH (ref 65–99)
Potassium: 4.1 mmol/L (ref 3.5–5.2)
Sodium: 138 mmol/L (ref 134–144)
TOTAL PROTEIN: 7.1 g/dL (ref 6.0–8.5)

## 2018-08-04 LAB — TSH: TSH: 0.976 u[IU]/mL (ref 0.450–4.500)

## 2018-08-04 NOTE — Progress Notes (Signed)
Internal Medicine Clinic Attending  Case discussed with Dr. Alfonse Spruce at the time of the visit.  We reviewed the resident's history and exam and pertinent patient test results.  I agree with the assessment, diagnosis, and plan of care documented in the resident's note.  The patient is being scheduled for a follow-up appointment with his PCP to discuss next steps including goals of care.  It should be stressed to his family about reversible causes of his weight loss are being sought but that this could also be secondary to advanced dementia and not reversible.  Prior discussions among the patient and his family regarding goals of care should be sought.  Based on this discussion, referral to outpatient palliative care or even hospice might be indicated.  Additionally, if the patient is experiencing any dysphasia, coughing or choking with swallowing, or any difficulty swallowing, he could be offered home speech therapy.  An in basket message has been sent to get this appointment scheduled.

## 2018-08-14 ENCOUNTER — Ambulatory Visit: Payer: Medicare Other | Admitting: Neurology

## 2018-08-15 ENCOUNTER — Telehealth: Payer: Self-pay

## 2018-08-15 ENCOUNTER — Encounter: Payer: Self-pay | Admitting: Neurology

## 2018-08-15 NOTE — Telephone Encounter (Signed)
Pt no show for appt on 08/14/2018.

## 2018-08-19 NOTE — Progress Notes (Signed)
CC: Weight loss, hypertension, and vascular dementia  HPI:  [name] Brennan is a 78 y.o.  with a PMH listed below presenting for weight loss, hypertension, and goals of care.   Please see A&P for status of the patient's chronic medical conditions  Past Medical History:  Diagnosis Date  . Allergy   . CRI (chronic renal insufficiency)   . Dyslipidemia    Combined with hx of marked hypertriglyceridemia  . Hearing loss   . Hemorrhoids 1979  . Hypertension   . Inguinal hernia September 2012   Bilateral   . Myocardial infarction, old    Remote inferior MI with known occlusion of the RCA.  Medical management  . Occlusive coronary artery disease    History of remote inferior MI.  Documented CTO of RCA with left-to-right collaterals -> treated medically.  NO STENT.  . Osteoarthritis   . Psoriasis   . Skin cancer    Review of Systems: Refer to history of present illness and assessment and plans for pertinent review of systems, all others reviewed and negative.  Physical Exam:  Vitals:   08/21/18 1322  BP: 138/73  Pulse: 63  Temp: 98.7 F (37.1 C)  TempSrc: Oral  SpO2: 99%  Weight: 134 lb 8 oz (61 kg)  Height: 5\' 10"  (1.778 m)    Physical Exam  Constitutional:  Elderly male, frail, NAD  HENT:  Head: Normocephalic and atraumatic.  Eyes: Pupils are equal, round, and reactive to light. Conjunctivae and EOM are normal.  Neck: Normal range of motion. Neck supple. No thyromegaly present.  Cardiovascular: Normal rate, regular rhythm and normal heart sounds.  Pulmonary/Chest: Effort normal and breath sounds normal. No respiratory distress.  Abdominal: Soft. Bowel sounds are normal. He exhibits no distension.  Musculoskeletal: Normal range of motion.        General: No edema.  Neurological: He is alert.  Skin: Skin is warm and dry.  Psychiatric:  Normal mood and affect, avoids answering questions, limited distant memory    Social History   Socioeconomic History  .  Marital status: Married    Spouse name: Not on file  . Number of children: 1  . Years of education: Not on file  . Highest education level: Not on file  Occupational History  . Occupation: WELDER    Fish farm manager: [employer]  Social Needs  . Financial resource strain: Not on file  . Food insecurity:    Worry: Not on file    Inability: Not on file  . Transportation needs:    Medical: Not on file    Non-medical: Not on file  Tobacco Use  . Smoking status: Never Smoker  . Smokeless tobacco: Never Used  Substance and Sexual Activity  . Alcohol use: No  . Drug use: No  . Sexual activity: Not on file  Lifestyle  . Physical activity:    Days per week: Not on file    Minutes per session: Not on file  . Stress: Not on file  Relationships  . Social connections:    Talks on phone: Not on file    Gets together: Not on file    Attends religious service: Not on file    Active member of club or organization: Not on file    Attends meetings of clubs or organizations: Not on file    Relationship status: Not on file  . Intimate partner violence:    Fear of current or ex partner: Not on file  Emotionally abused: Not on file    Physically abused: Not on file    Forced sexual activity: Not on file  Other Topics Concern  . Not on file  Social History Narrative  . Not on file    Family History  Problem Relation Age of Onset  . Unexplained death Mother   . Heart attack Father 61  . Prostate cancer Brother   . Skin cancer Brother     Assessment & Plan:   See Encounters Tab for problem based charting.  Patient discussed with Dr. Angelia Mould

## 2018-08-21 ENCOUNTER — Ambulatory Visit (INDEPENDENT_AMBULATORY_CARE_PROVIDER_SITE_OTHER): Payer: Medicare Other | Admitting: Internal Medicine

## 2018-08-21 ENCOUNTER — Encounter: Payer: Self-pay | Admitting: Internal Medicine

## 2018-08-21 ENCOUNTER — Other Ambulatory Visit: Payer: Self-pay

## 2018-08-21 VITALS — BP 138/73 | HR 63 | Temp 98.7°F | Ht 70.0 in | Wt 134.5 lb

## 2018-08-21 DIAGNOSIS — F01518 Vascular dementia, unspecified severity, with other behavioral disturbance: Secondary | ICD-10-CM

## 2018-08-21 DIAGNOSIS — F0151 Vascular dementia with behavioral disturbance: Secondary | ICD-10-CM

## 2018-08-21 DIAGNOSIS — F0150 Vascular dementia without behavioral disturbance: Secondary | ICD-10-CM | POA: Diagnosis not present

## 2018-08-21 DIAGNOSIS — R634 Abnormal weight loss: Secondary | ICD-10-CM | POA: Diagnosis not present

## 2018-08-21 DIAGNOSIS — Z681 Body mass index (BMI) 19 or less, adult: Secondary | ICD-10-CM

## 2018-08-21 DIAGNOSIS — Z79899 Other long term (current) drug therapy: Secondary | ICD-10-CM

## 2018-08-21 DIAGNOSIS — I10 Essential (primary) hypertension: Secondary | ICD-10-CM

## 2018-08-21 MED ORDER — MELATONIN 5 MG PO TABS: ORAL_TABLET | ORAL | 0 refills | Status: AC

## 2018-08-21 MED ORDER — METOPROLOL SUCCINATE ER 25 MG PO TB24: 12.5000 mg | ORAL_TABLET | Freq: Every day | ORAL | 1 refills | Status: AC

## 2018-08-21 MED ORDER — MIRTAZAPINE 7.5 MG PO TABS: 7.5000 mg | ORAL_TABLET | Freq: Every day | ORAL | 2 refills | Status: DC

## 2018-08-21 NOTE — Patient Instructions (Addendum)
Thank you for allowing [place] to provide your care today. Today we discussed your weight loss, dementia and hypertension    I have ordered no labs for you. I will call if any are abnormal.    Today we made some changes to your medications.   Please decrease you metoprolol to 12.5 mg daily I have sent in a refill for your melatonin Please stop taking the sertraline, please start taking mirtazipine Please stop taking the Crestor and fenofibrate Please continue to drink your boost supplements and anything that tastes good.   Please follow-up in 3 months.    Should you have any questions or concerns please call the internal medicine clinic at [phone].

## 2018-08-22 NOTE — Assessment & Plan Note (Signed)
Patient is on metoprolol 25 mg daily.  Blood pressure today is 138/73. On the last visit patient's lisinopril was held and metoprolol was decreased to it's current dose due to hypotension.  Wife reports that last week he had blood pressures down to 100/60's, she held the metoprolol about 3 days last week because of this. Given his vascular dementia, age, and episodes of hypotension I believe that a high blood pressure goal is reasonable and the family feels the same.   Plan: -Decrease metoprolol to 12.5 mg daily, advised family to try to take it daily and that if his blood pressure increases they can restart the metoprolol 25 mg daily

## 2018-08-22 NOTE — Assessment & Plan Note (Signed)
Mr. Wanke has been having decreased appetite for about 10 months now, he has lost about 50 lbs. He was last seen on 2/13 where he was noted to have weight loss, decreased appetite, increasing memory issues, and generalized weakness. CBC, CMP, and TSH were all normal. Today his weight is the same. The wife and son are present and provide a lot of the history. The patient was having altered taste buds, he reports that he feels like everything has a gritty taste to it, he has been drinking a lot of ensures and does not have any issues with swallowing. He reports that he has an appetite and wants to eat but that the taste is what makes him not eat. He reports that he last saw neurology in November. Discussed with family that this is likely related to his advancing dementia. MoCA was 12/30 today.   Plan: -Offered palliative care consult however patient and family declined -Switched sertraline to mirtazapine to possibly increase appetite -Stopped Crestor and fenofibrate

## 2018-08-22 NOTE — Assessment & Plan Note (Addendum)
Family reports that he is still having good and bad days. he family reports that he sometimes needs help with dressing himself and taking his medications, he is only taking sponge bathes at this time. His wife provides most of the support at home and gives him his medications, helps with his food, and anything else that he needs. Son also provides a lot of support. He is still able to enjoy life and jokes with the wife and son. He often gets irritated however he is taking sertraline that helps with his irritability and mood. He is still taking melatonin that helps with sleep. The families main concern is his appetite. He has been having decreased appetite for about 10 months now, he has lost about 50 lbs. He was last seen on 2/13 where he was noted to have weight loss, decreased appetite, increasing memory issues, and generalized weakness. CBC, CMP, and TSH were all normal. Today his weight is the same. The wife and son are present and provide a lot of the history. The patient was having altered taste buds, he reports that he feels like everything has a gritty taste to it, he has been drinking a lot of ensures and does not have any issues with swallowing. He reports that he has an appetite and wants to eat but that the taste is what makes him not eat. He reports that he last saw neurology in November. MoCA was 12/30 today.   Discussed with family that this is likely related to his underlying dementia, discussed goals of care with him and his wife. The family reported that the wife, Ms. Bryngelson would be the power of attorney. We started discussing the MOST form however they did not want to fill it out at this time because they did not feel like they were at that point yet. We discussed getting palliative care involved given the progression of his disease however the patient reported that he does not like going to doctors and does not feel like he is nearing end of life. Informed him and wife that they would be  available to discuss the prognosis and natural course of his disease and that they would be able to visit him at his house however patient and family still declined.   Plan: -Provided patient and wife with MOST form to look over, will further discuss this on his next visit -Palliative care consult was offered but patient declined, may further discuss this on the next visit depending on how he is doing -Advised patient to follow up with neurology -Advised patient to continue to drink boost and supplements -Given his altered taste buds will switch sertraline to mirtazipine -Given his recent lipid panel and advancing dementia advised patient to stop Crestor and fenofibrate

## 2018-08-24 NOTE — Progress Notes (Signed)
Internal Medicine Clinic Attending  Case discussed with Dr. Krienke at the time of the visit.  We reviewed the resident's history and exam and pertinent patient test results.  I agree with the assessment, diagnosis, and plan of care documented in the resident's note.    

## 2018-08-31 ENCOUNTER — Other Ambulatory Visit: Payer: Self-pay

## 2018-08-31 ENCOUNTER — Ambulatory Visit (INDEPENDENT_AMBULATORY_CARE_PROVIDER_SITE_OTHER): Payer: Medicare Other | Admitting: Internal Medicine

## 2018-08-31 ENCOUNTER — Ambulatory Visit (HOSPITAL_COMMUNITY)
Admission: RE | Admit: 2018-08-31 | Discharge: 2018-08-31 | Disposition: A | Discharge status: Home / Self Care | Payer: Medicare Other | Source: Ambulatory Visit | Attending: Family Medicine | Admitting: Family Medicine

## 2018-08-31 ENCOUNTER — Encounter: Payer: Self-pay | Admitting: Internal Medicine

## 2018-08-31 VITALS — BP 124/61 | HR 64 | Temp 98.1°F | Ht 70.0 in | Wt 134.4 lb

## 2018-08-31 DIAGNOSIS — R9431 Abnormal electrocardiogram [ECG] [EKG]: Secondary | ICD-10-CM | POA: Diagnosis not present

## 2018-08-31 DIAGNOSIS — Z681 Body mass index (BMI) 19 or less, adult: Secondary | ICD-10-CM

## 2018-08-31 DIAGNOSIS — I499 Cardiac arrhythmia, unspecified: Secondary | ICD-10-CM

## 2018-08-31 DIAGNOSIS — R634 Abnormal weight loss: Secondary | ICD-10-CM

## 2018-08-31 DIAGNOSIS — Z79899 Other long term (current) drug therapy: Secondary | ICD-10-CM

## 2018-08-31 DIAGNOSIS — R0602 Shortness of breath: Secondary | ICD-10-CM

## 2018-08-31 NOTE — Assessment & Plan Note (Addendum)
BP Readings from Last 3 Encounters:  08/31/18 124/61  08/21/18 138/73  08/03/18 (!) 90/57   For the past two nights he has woken up gasping and feeling short of breath. This has never happened before and only occurs while he is laying down at night. He denies chest pain, dyspnea on exertion, nausea, tingling of the extremities or focal weakness. His wife states he sometimes seems to be breathing strangely at night. He sleeps mildly elevated. He has no shortness of breath currently. He has had chronic mild cough for years, no sick contacts. On PE he had irregular rhythm but EKG was obtained and showed T wave lateral/inferior T-wave flattening. Recently wore halter monitor which showed frequent PVC & PACs with short runs of PATs. They recommended follow-up if he became symptomatic, and with possible paroxysmal A. Fib I am concerned he may need longer monitoring. Differential includes HF, OSA and paroxysmal a. Fib, although his symptoms are only happening while lying down and he does not exhibit the typical physical findings for OSA. No other signs of fluid overload present at this time. He has continued to take his toprol XL 25 MG as he did not have new prescription and this medication cannot be broken in half. His wife states his blood pressure has been fluctuating more frequently. Blood pressure today within normal limits and will continue at 25 MG at this time.   - obtain echo  - f/u with cardiology  - f/u in two weeks, earlier if SOB continues or increases.

## 2018-08-31 NOTE — Progress Notes (Signed)
   CC: shortness of breath  HPI:  [name] is a 79 y.o. with PMH as below.   Please see A&P for assessment of the patient's acute and chronic medical conditions presenting with waking up at night with shortness of breath. He is accompanied by his wife and son and much of the history is provided by them.    Past Medical History:  Diagnosis Date  . Allergy   . CRI (chronic renal insufficiency)   . Dyslipidemia    Combined with hx of marked hypertriglyceridemia  . Hearing loss   . Hemorrhoids 1979  . Hypertension   . Inguinal hernia September 2012   Bilateral   . Myocardial infarction, old    Remote inferior MI with known occlusion of the RCA.  Medical management  . Occlusive coronary artery disease    History of remote inferior MI.  Documented CTO of RCA with left-to-right collaterals -> treated medically.  NO STENT.  . Osteoarthritis   . Psoriasis   . Skin cancer    Review of Systems:   Review of Systems  Constitutional: Positive for weight loss. Negative for chills, diaphoresis, fever and malaise/fatigue.  HENT: Negative for congestion, ear pain, sinus pain and sore throat.   Respiratory: Positive for shortness of breath. Negative for cough, hemoptysis, sputum production and wheezing.   Gastrointestinal: Negative for constipation, diarrhea, heartburn, nausea and vomiting.  Neurological: Negative for tingling and focal weakness.   Physical Exam:  Constitution: NAD, appears stated age HENT: [place]/AT Cardio: irregular rhythm, no m/r/g  Respiratory: CTA, no w/r/r  MSK: no pitting edema, moving all extremities  Neuro: a&ox3, some mild confusion   Vitals:   08/31/18 1343  BP: 124/61  Pulse: 64  Temp: 98.1 F (36.7 C)  TempSrc: Oral  SpO2: 97%  Weight: 134 lb 6.4 oz (61 kg)  Height: 5\' 10"  (1.778 m)     Assessment & Plan:   See Encounters Tab for problem based charting.  Patient discussed with Dr. Rebeca Alert

## 2018-08-31 NOTE — Patient Instructions (Addendum)
Thank you for allowing [place] to provide your care today. Today we discussed shortness of breath.   Today we made the following changes to your medications:   Please call your cardiologist to make an earlier appointment due to shortness of breath.   Please follow-up in two weeks. Please follow-up earlier if you have increasing shortness of breath or shortness of breath on exertion.  Should you have any questions or concerns please call the internal medicine clinic at [phone].

## 2018-09-01 DIAGNOSIS — R269 Unspecified abnormalities of gait and mobility: Secondary | ICD-10-CM | POA: Diagnosis not present

## 2018-09-01 NOTE — Progress Notes (Signed)
Internal Medicine Clinic Attending  Case discussed with Dr. Sharon Seller at the time of the visit.  We reviewed the resident's history and exam and pertinent patient test results.  I agree with the assessment, diagnosis, and plan of care documented in the resident's note.  78 yo man here with episodes of paroxysmal nocturnal dyspnea. This is most concerning for new onset CHF, but could also be consistent with OSA, central sleep apnea, or COPD. Starting with TTE, if normal, will consider PFTs given chronic cough.   Lenice Pressman, M.D., Ph.D.

## 2018-09-03 ENCOUNTER — Other Ambulatory Visit: Payer: Self-pay | Admitting: Internal Medicine

## 2018-09-12 ENCOUNTER — Telehealth: Payer: Self-pay | Admitting: *Deleted

## 2018-09-12 ENCOUNTER — Other Ambulatory Visit: Payer: Self-pay | Admitting: Internal Medicine

## 2018-09-12 DIAGNOSIS — F01518 Vascular dementia, unspecified severity, with other behavioral disturbance: Secondary | ICD-10-CM

## 2018-09-12 DIAGNOSIS — F0151 Vascular dementia with behavioral disturbance: Secondary | ICD-10-CM

## 2018-09-12 NOTE — Telephone Encounter (Signed)
Spoke with patient's wife. States she doesn't want to give patient mirtazapine and prefers to continue sertraline. States patient is becoming agitated with increased appetite on mirtazapine because he can't eat; all food has a "foul odor and tastes like dirt." She is afraid he may become violent as this is listed as number one side effect with mirtazapine. Hubbard Hartshorn, RN, BSN

## 2018-09-13 ENCOUNTER — Other Ambulatory Visit (HOSPITAL_COMMUNITY): Payer: Medicare Other

## 2018-09-13 MED ORDER — SERTRALINE HCL 25 MG PO TABS: 25.0000 mg | ORAL_TABLET | Freq: Every day | ORAL | 2 refills | Status: DC

## 2018-09-13 NOTE — Telephone Encounter (Signed)
Wife notified and is very Patent attorney. Hubbard Hartshorn, RN, BSN

## 2018-09-13 NOTE — Telephone Encounter (Signed)
That is fine, if they prefer sertraline we can restart that medication. We had tried mirtazapine to see if taht would help with his diet but if it make it worse we can go back to sertraline. I have sent in a prescription to their pharmacy. Thank you.

## 2018-09-14 ENCOUNTER — Ambulatory Visit (HOSPITAL_COMMUNITY): Payer: Medicare Other

## 2018-09-14 ENCOUNTER — Ambulatory Visit: Payer: Medicare Other

## 2018-09-15 ENCOUNTER — Other Ambulatory Visit (HOSPITAL_COMMUNITY): Payer: Medicare Other

## 2018-10-02 ENCOUNTER — Telehealth: Payer: Self-pay | Admitting: *Deleted

## 2018-10-02 DIAGNOSIS — R269 Unspecified abnormalities of gait and mobility: Secondary | ICD-10-CM | POA: Diagnosis not present

## 2018-10-02 NOTE — Telephone Encounter (Signed)
LEFT MESSAGE NEED RESCHED 4/21 APPT W/DR HARDING. -- TO AUG 2020 , PATIENT HAS NOT HAD ECHO ( UNTIL 11/16/18)

## 2018-10-05 NOTE — Telephone Encounter (Signed)
LEFT MESSAGE TO CALL BACK NEED RESCHEDULE FOR LATER PART OF YEAR.

## 2018-10-06 ENCOUNTER — Other Ambulatory Visit: Payer: Self-pay | Admitting: Internal Medicine

## 2018-10-06 DIAGNOSIS — F01518 Vascular dementia, unspecified severity, with other behavioral disturbance: Secondary | ICD-10-CM

## 2018-10-06 DIAGNOSIS — F0151 Vascular dementia with behavioral disturbance: Secondary | ICD-10-CM

## 2018-10-10 ENCOUNTER — Telehealth: Payer: Self-pay

## 2018-10-10 ENCOUNTER — Ambulatory Visit: Payer: Medicare Other | Admitting: Cardiology

## 2018-10-10 NOTE — Telephone Encounter (Signed)
Per dr Shan Levans, no more lax/ stool soft, increase liquids, try food- plain rice, plain potatoe, mash banana, applesauce. She is agreeable. She isnt sure how much he is voiding. Ask her to wrap penis in toilet tissue and keep away from stool then check for wetness. She is agreeable. Will call tomorrow for update

## 2018-10-10 NOTE — Telephone Encounter (Signed)
NURSING TRIAGE NOTE FOR RESPIRATORY SYMPTOMS  Do you have a fever? "yes, I dont know but his skin is hot and then he cools off and then gets real hot again"   Do you have a cough?"now and then, he usually has one, coughing up white to clear stuff"   Do you have shortness of breath more than normal?"no more than usual"  Do you have chest pain?"not that I am aware of"  Are you able to eat and drink normally? "hes on boost, he hasnt eaten since November, he drinks, he says food taste like sand and dirt"  Have you seen a physician for these symptoms? "no"  ** yesterday he was constipated, I gave senekot yesterday 6 stool softners and 2 laxatives in less than a day, now today he has diarrhea and its real runny, he says his abdomen is sore  Action : no more stool soft/ laxatives, drink plenty of liquids. Either doctor or nurse will call back.  I informed patient to expect a phone call from a physician soon and I sent request to front desk to schedule a virtual office appt for patient and arrive the patient.

## 2018-10-10 NOTE — Telephone Encounter (Signed)
Pt's wife states, pt is having diarrhea along with fever. Please call back.

## 2018-10-11 ENCOUNTER — Other Ambulatory Visit: Payer: Self-pay

## 2018-10-11 ENCOUNTER — Emergency Department (HOSPITAL_COMMUNITY): Payer: Medicare Other

## 2018-10-11 ENCOUNTER — Emergency Department (HOSPITAL_COMMUNITY)
Admission: EM | Admit: 2018-10-11 | Discharge: 2018-10-11 | Disposition: A | Discharge status: Home / Self Care | Payer: Medicare Other | Attending: Emergency Medicine | Admitting: Emergency Medicine

## 2018-10-11 ENCOUNTER — Encounter (HOSPITAL_COMMUNITY): Payer: Self-pay | Admitting: Emergency Medicine

## 2018-10-11 DIAGNOSIS — Z79899 Other long term (current) drug therapy: Secondary | ICD-10-CM | POA: Diagnosis not present

## 2018-10-11 DIAGNOSIS — R531 Weakness: Secondary | ICD-10-CM | POA: Insufficient documentation

## 2018-10-11 DIAGNOSIS — Z85828 Personal history of other malignant neoplasm of skin: Secondary | ICD-10-CM | POA: Insufficient documentation

## 2018-10-11 DIAGNOSIS — Z20828 Contact with and (suspected) exposure to other viral communicable diseases: Secondary | ICD-10-CM | POA: Diagnosis not present

## 2018-10-11 DIAGNOSIS — R1084 Generalized abdominal pain: Secondary | ICD-10-CM | POA: Diagnosis not present

## 2018-10-11 DIAGNOSIS — I252 Old myocardial infarction: Secondary | ICD-10-CM | POA: Diagnosis not present

## 2018-10-11 DIAGNOSIS — R339 Retention of urine, unspecified: Secondary | ICD-10-CM | POA: Insufficient documentation

## 2018-10-11 DIAGNOSIS — Z7982 Long term (current) use of aspirin: Secondary | ICD-10-CM | POA: Diagnosis not present

## 2018-10-11 DIAGNOSIS — R0689 Other abnormalities of breathing: Secondary | ICD-10-CM | POA: Diagnosis not present

## 2018-10-11 DIAGNOSIS — R509 Fever, unspecified: Secondary | ICD-10-CM | POA: Diagnosis not present

## 2018-10-11 DIAGNOSIS — E860 Dehydration: Secondary | ICD-10-CM | POA: Insufficient documentation

## 2018-10-11 DIAGNOSIS — I10 Essential (primary) hypertension: Secondary | ICD-10-CM | POA: Diagnosis not present

## 2018-10-11 DIAGNOSIS — I2510 Atherosclerotic heart disease of native coronary artery without angina pectoris: Secondary | ICD-10-CM | POA: Diagnosis not present

## 2018-10-11 DIAGNOSIS — R0600 Dyspnea, unspecified: Secondary | ICD-10-CM | POA: Diagnosis not present

## 2018-10-11 DIAGNOSIS — R069 Unspecified abnormalities of breathing: Secondary | ICD-10-CM | POA: Diagnosis not present

## 2018-10-11 DIAGNOSIS — R079 Chest pain, unspecified: Secondary | ICD-10-CM | POA: Diagnosis not present

## 2018-10-11 LAB — CBC WITH DIFFERENTIAL/PLATELET
Abs Immature Granulocytes: 0.2 10*3/uL — ABNORMAL HIGH (ref 0.00–0.07)
Basophils Absolute: 0 10*3/uL (ref 0.0–0.1)
Basophils Relative: 0 %
Eosinophils Absolute: 0 10*3/uL (ref 0.0–0.5)
Eosinophils Relative: 0 %
HCT: 45.5 % (ref 39.0–52.0)
Hemoglobin: 14.8 g/dL (ref 13.0–17.0)
Immature Granulocytes: 1 %
Lymphocytes Relative: 4 %
Lymphs Abs: 0.7 10*3/uL (ref 0.7–4.0)
MCH: 30.1 pg (ref 26.0–34.0)
MCHC: 32.5 g/dL (ref 30.0–36.0)
MCV: 92.7 fL (ref 80.0–100.0)
Monocytes Absolute: 1.7 10*3/uL — ABNORMAL HIGH (ref 0.1–1.0)
Monocytes Relative: 9 %
Neutro Abs: 15.9 10*3/uL — ABNORMAL HIGH (ref 1.7–7.7)
Neutrophils Relative %: 86 %
Platelets: 245 10*3/uL (ref 150–400)
RBC: 4.91 MIL/uL (ref 4.22–5.81)
RDW: 13.5 % (ref 11.5–15.5)
WBC: 18.5 10*3/uL — ABNORMAL HIGH (ref 4.0–10.5)
nRBC: 0 % (ref 0.0–0.2)

## 2018-10-11 LAB — COMPREHENSIVE METABOLIC PANEL
ALT: 21 U/L (ref 0–44)
AST: 36 U/L (ref 15–41)
Albumin: 4.3 g/dL (ref 3.5–5.0)
Alkaline Phosphatase: 220 U/L — ABNORMAL HIGH (ref 38–126)
Anion gap: 13 (ref 5–15)
BUN: 36 mg/dL — ABNORMAL HIGH (ref 8–23)
CO2: 21 mmol/L — ABNORMAL LOW (ref 22–32)
Calcium: 11.3 mg/dL — ABNORMAL HIGH (ref 8.9–10.3)
Chloride: 104 mmol/L (ref 98–111)
Creatinine, Ser: 1.05 mg/dL (ref 0.61–1.24)
GFR calc Af Amer: >60 mL/min (ref >60)
GFR calc non Af Amer: >60 mL/min (ref >60)
Glucose, Bld: 168 mg/dL — ABNORMAL HIGH (ref 70–99)
Potassium: 3.5 mmol/L (ref 3.5–5.1)
Sodium: 138 mmol/L (ref 135–145)
Total Bilirubin: 1.8 mg/dL — ABNORMAL HIGH (ref 0.3–1.2)
Total Protein: 8.3 g/dL — ABNORMAL HIGH (ref 6.5–8.1)

## 2018-10-11 LAB — URINALYSIS, ROUTINE W REFLEX MICROSCOPIC
Bilirubin Urine: NEGATIVE
Glucose, UA: NEGATIVE mg/dL
Ketones, ur: NEGATIVE mg/dL
Leukocytes,Ua: NEGATIVE
Nitrite: NEGATIVE
Protein, ur: 30 mg/dL — AB
RBC / HPF: >50 RBC/hpf — ABNORMAL HIGH (ref 0–5)
Specific Gravity, Urine: 1.024 (ref 1.005–1.030)
pH: 5 (ref 5.0–8.0)

## 2018-10-11 LAB — CK: Total CK: 80 U/L (ref 49–397)

## 2018-10-11 LAB — SARS CORONAVIRUS 2 BY RT PCR (HOSPITAL ORDER, PERFORMED IN ~~LOC~~ HOSPITAL LAB): SARS Coronavirus 2: NEGATIVE

## 2018-10-11 MED ORDER — SODIUM CHLORIDE 0.9 % IV BOLUS (SEPSIS)
1000.0000 mL | Freq: Once | INTRAVENOUS | Status: AC
  Administered 2018-10-11: 02:00:00 1000 mL via INTRAVENOUS

## 2018-10-11 MED ORDER — TAMSULOSIN HCL 0.4 MG PO CAPS: 0.4000 mg | ORAL_CAPSULE | Freq: Every day | ORAL | 0 refills | Status: AC

## 2018-10-11 NOTE — Telephone Encounter (Signed)
Called spouse/caregiver to f/u, lm for needs to call imc.

## 2018-10-11 NOTE — ED Notes (Signed)
After insertion of urinary catheter, the mass of the lower abdomen that was present upon arrival to the ED was no longer present after >800 mL of urine was in catheter bag. Pt states that he is already feeling relief.

## 2018-10-11 NOTE — ED Notes (Addendum)
Emptied Pt.s Foley at 2:23am at 1,080 ML.

## 2018-10-11 NOTE — ED Notes (Signed)
CONTACT INFO:  Esmeralda (son): [phone]

## 2018-10-11 NOTE — ED Triage Notes (Signed)
Pt brought from home via [facility]. Pts family reports pt has had diarrhea and urinary retention that started 4 days ago. Pt has hard tender mass on abdomen. Family also reports fever at home.

## 2018-10-11 NOTE — ED Notes (Signed)
D/C instructions given to pt, wife, and son. Catheter care and how to switch from a standard beg to a leg was given. This RN stated that she would call the pt when the test results came back.

## 2018-10-11 NOTE — ED Notes (Signed)
This RN and another RN walked pt to nurse's station with moderate assistance. EDP aware

## 2018-10-11 NOTE — ED Provider Notes (Signed)
[name] EMERGENCY DEPARTMENT Provider Note   CSN: [id number] Arrival date & time: 10/11/18  0051    History   Chief Complaint Chief Complaint  Patient presents with  . Weakness   Level 5 caveat due to altered mental status HPI [name] Brennan is a 78 y.o. male.     The history is provided by the patient, the EMS personnel and the spouse.  Weakness  Severity:  Moderate Onset quality:  Gradual Timing:  Constant Progression:  Unchanged Chronicity:  New Relieved by:  Nothing Worsened by:  Nothing Associated symptoms: cough and diarrhea   Patient presents from home. EMS reports patient is had diarrhea for several days as well as urinary retention.  They also noted a tender mass on his abdomen.  It has also been reported that he has had fevers at home. Patient has a history of dementia, CAD  Past Medical History:  Diagnosis Date  . Allergy   . CRI (chronic renal insufficiency)   . Dyslipidemia    Combined with hx of marked hypertriglyceridemia  . Hearing loss   . Hemorrhoids 1979  . Hypertension   . Inguinal hernia September 2012   Bilateral   . Myocardial infarction, old    Remote inferior MI with known occlusion of the RCA.  Medical management  . Occlusive coronary artery disease    History of remote inferior MI.  Documented CTO of RCA with left-to-right collaterals -> treated medically.  NO STENT.  . Osteoarthritis   . Psoriasis   . Skin cancer     Patient Active Problem List   Diagnosis Date Noted  . SOB (shortness of breath) 08/31/2018  . Weight loss, unintentional 08/03/2018  . Irregular heartbeat 03/29/2018  . Occipital pain 02/23/2018  . Vascular dementia ([place]) 02/13/2018  . Decreased oral intake 02/06/2018  . Atherosclerotic heart disease   . Hypertension   . Dyslipidemia   . History of bilateral inguinal hernia repair 03/24/2011  . Occlusive coronary artery disease 01/13/2004    Past Surgical History:  Procedure Laterality Date  . CARDIAC  CATHETERIZATION  01/13/2004   Single-vessel CAD- mRCA CTO with left-to-right collaterals & faint R-R collaterals.  EF 50-55%  . cataract     with lens implants  . HEMORROIDECTOMY    . INGUINAL HERNIA REPAIR  03/11/2011   bilateral        Home Medications    Prior to Admission medications   Medication Sig Start Date End Date Taking? Authorizing Provider  aspirin 81 MG tablet Take 81 mg by mouth daily.      [provider]  folic acid (FOLVITE) 1 MG tablet Take 1 tablet (1 mg total) by mouth daily. 03/17/18   Garvin Fila, MD  GuaiFENesin (MUCUS RELIEF ADULT PO) Take by mouth as needed.     [provider]  Melatonin ([employer] MELATONIN) 5 MG TABS TAKE 1 TABLET (5 MG TOTAL) BY MOUTH AT BEDTIME AS NEEDED. 08/21/18   Asencion Noble, MD  Methylfol-Methylcob-Acetylcyst (CEREFOLIN NAC) 6-2-600 MG TABS Take 1 tablet by mouth daily. 06/07/18   Garvin Fila, MD  metoprolol succinate (TOPROL-XL) 25 MG 24 hr tablet Take 1 tablet (25 mg total) by mouth daily. 08/03/18   Carroll Sage, MD  metoprolol succinate (TOPROL-XL) 25 MG 24 hr tablet Take 0.5 tablets (12.5 mg total) by mouth daily. Take with or immediately following a meal. 08/21/18   Asencion Noble, MD  sennosides-docusate sodium (SENOKOT-S) 8.6-50 MG tablet Take  1 tablet by mouth 2 (two) times daily.    [provider]  sertraline (ZOLOFT) 25 MG tablet TAKE 1 TABLET BY MOUTH EVERY DAY 10/06/18   Annia Belt, MD  triamcinolone (KENALOG) 0.1 % cream Apply 1 application topically daily as needed (rash).  11/12/10   [provider]    Family History Family History  Problem Relation Age of Onset  . Unexplained death Mother   . Heart attack Father 82  . Prostate cancer Brother   . Skin cancer Brother     Social History Social History   Tobacco Use  . Smoking status: Never Smoker  . Smokeless tobacco: Never Used  Substance Use Topics  . Alcohol use: No  . Drug use: No     Allergies    Amoxicillin; Atorvastatin; Avalide [irbesartan-hydrochlorothiazide]; Cymbalta [duloxetine hcl]; Escitalopram oxalate; and Fenofibrate   Review of Systems Review of Systems  Unable to perform ROS: Mental status change  Respiratory: Positive for cough.   Gastrointestinal: Positive for diarrhea.  Genitourinary: Positive for decreased urine volume.  Neurological: Positive for weakness.     Physical Exam Updated Vital Signs BP (!) 127/98 (BP Location: Right Arm)   Pulse 85   Temp 100 F (37.8 C) (Rectal)   Resp 19   SpO2 99%   Physical Exam CONSTITUTIONAL: Elderly, frail, mildly agitated HEAD: Normocephalic/atraumatic EYES: EOMI/PERRL ENMT: Mask in place NECK: supple no meningeal signs SPINE/BACK:entire spine nontender CV: S1/S2 noted, no murmurs/rubs/gallops noted LUNGS: Lungs are clear to auscultation bilaterally, no apparent distress ABDOMEN: soft, tender mass noted to suprapubic region, no rebound or guarding, bowel sounds noted throughout abdomen GU:no cva tenderness, stool color brown, no blood or melena, chaperone present NEURO: Pt is awake/alert, moves all extremities x4 He appears confused EXTREMITIES: pulses normal/equal, full ROM, pelvis stable, all other extremities/joints palpated/ranged and nontender SKIN: warm, color normal PSYCH: Unable to assess  ED Treatments / Results  Labs (all labs ordered are listed, but only abnormal results are displayed) Labs Reviewed  CBC WITH DIFFERENTIAL/PLATELET - Abnormal; Notable for the following components:      Result Value   WBC 18.5 (*)    Neutro Abs 15.9 (*)    Monocytes Absolute 1.7 (*)    Abs Immature Granulocytes 0.20 (*)    All other components within normal limits  COMPREHENSIVE METABOLIC PANEL - Abnormal; Notable for the following components:   CO2 21 (*)    Glucose, Bld 168 (*)    BUN 36 (*)    Calcium 11.3 (*)    Total Protein 8.3 (*)    Alkaline Phosphatase 220 (*)    Total Bilirubin 1.8 (*)    All  other components within normal limits  URINALYSIS, ROUTINE W REFLEX MICROSCOPIC - Abnormal; Notable for the following components:   Color, Urine AMBER (*)    APPearance HAZY (*)    Hgb urine dipstick LARGE (*)    Protein, ur 30 (*)    RBC / HPF >50 (*)    Bacteria, UA RARE (*)    All other components within normal limits  URINE CULTURE  SARS CORONAVIRUS 2 (HOSPITAL ORDER, [facility])  CK    EKG EKG Interpretation  Date/Time:  Wednesday October 11 2018 01:04:46 EDT Ventricular Rate:  85 PR Interval:    QRS Duration: 85 QT Interval:  395 QTC Calculation: 470 R Axis:   -58 Text Interpretation:  Sinus rhythm Multiple premature complexes, vent & supraven Abnormal R-wave progression, early  transition Interpretation limited secondary to artifact Abnormal ekg Confirmed by Ripley Fraise ([id number]) on 10/11/2018 1:18:05 AM   Radiology Dg Chest Port 1 View  Result Date: 10/11/2018 CLINICAL DATA:  [facility] EXAM: PORTABLE CHEST 1 VIEW COMPARISON:  02/06/2018 FINDINGS: Heart and mediastinal contours are within normal limits. No focal opacities or effusions. No acute bony abnormality. IMPRESSION: No active disease. Electronically Signed   By: Rolm Baptise M.D.   On: 10/11/2018 01:49    Procedures Procedures    Medications Ordered in ED Medications  sodium chloride 0.9 % bolus 1,000 mL (1,000 mLs Intravenous New Bag/Given 10/11/18 0209)     Initial Impression / Assessment and Plan / ED Course  I have reviewed the triage vital signs and the nursing notes.  Pertinent labs & imaging results that were available during my care of the patient were reviewed by me and considered in my medical decision making (see chart for details).        1:21 AM Patient presents from home for weakness, diarrhea urinary retention.  After my initial evaluation I spoke to his wife and son.  They report patient does have dementia and has been getting worse in mental status.  He is also  been having loose stools, and today they reported he appeared short of breath and was coughing up blood. They are also concerned about the mass in his lower abdomen.  She also confirmed urinary retention Phone number for family  [phone] Nursing staff, bladder scan reveals greater than 900 mL's of urine in bladder.  Imaging/Labs are pending  1:55 AM After Foley catheter placement, patient is feeling improved.  There is no abdominal mass at this time.  He had greater than 1000 L in his urine. Work-up is pending at this time 2:35 AM X-ray is negative.  Labs reveal dehydration hematuria.  Patient feels much improved with no focal abdominal tenderness.  He reports he feels well.  I discussed the case with family via phone. We will give p.o. fluids and attempt to ambulate. Suspect some of his urinary retention was due to hematuria. No cough/sob noted No hypoxia noted 3:28 AM Patient appears near baseline.  He is able to ambulate.  He is taking p.o. fluids He has no complaints. He did have an increased temperature with an elevated white count of unclear etiology.  Family reports recent loose stools/diarrhea.  He reportedly have coughing and shortness of breath prior to arrival, but here he has no respiratory symptoms.  His chest x-ray was reviewed and  was negative COVID-19 is a possibility, after discussion with family will test for this. Patient will be discharged home, home health has been ordered, he will be referred to urology for his Foley.  Discussed use of this at home with family via phone.  We will also add on Flomax.  LADARRIOUS KIRKSEY was evaluated in Emergency Department on 10/11/2018 for the symptoms described in the history of present illness. He was evaluated in the context of the global COVID-19 pandemic, which necessitated consideration that the patient might be at risk for infection with the SARS-CoV-2 virus that causes COVID-19. Institutional protocols and algorithms that pertain to  the evaluation of patients at risk for COVID-19 are in a state of rapid change based on information released by regulatory bodies including the CDC and federal and state organizations. These policies and algorithms were followed during the patient's care in the ED.  Final Clinical Impressions(s) / ED Diagnoses   Final diagnoses:  Weakness  Dehydration  Urinary retention    ED Discharge Orders         Ordered    tamsulosin (FLOMAX) 0.4 MG CAPS capsule  Daily after supper     10/11/18 9144           Ripley Fraise, MD 10/11/18 0330

## 2018-10-12 LAB — URINE CULTURE: Culture: NO GROWTH

## 2018-10-13 DIAGNOSIS — K5641 Fecal impaction: Secondary | ICD-10-CM | POA: Diagnosis not present

## 2018-10-13 DIAGNOSIS — R338 Other retention of urine: Secondary | ICD-10-CM | POA: Diagnosis not present

## 2018-10-17 ENCOUNTER — Other Ambulatory Visit (HOSPITAL_COMMUNITY): Payer: Medicare Other

## 2018-10-23 ENCOUNTER — Other Ambulatory Visit: Payer: Self-pay | Admitting: Internal Medicine

## 2018-10-23 DIAGNOSIS — I10 Essential (primary) hypertension: Secondary | ICD-10-CM

## 2018-10-23 NOTE — Telephone Encounter (Signed)
Pt 's wife calls this am and states he has a F/C in place from urology, they have been instructing her on his care. They instructed her on using enemas and its not working. She doesn't know who to call. She is ask to call urology first then if needed to rtc to [facility], she is agreeable. She is also asking for a pallative care referral. [facility] would be a good pallative choice.

## 2018-10-27 DIAGNOSIS — R338 Other retention of urine: Secondary | ICD-10-CM | POA: Diagnosis not present

## 2018-11-01 DIAGNOSIS — R269 Unspecified abnormalities of gait and mobility: Secondary | ICD-10-CM | POA: Diagnosis not present

## 2018-11-02 ENCOUNTER — Ambulatory Visit (INDEPENDENT_AMBULATORY_CARE_PROVIDER_SITE_OTHER): Payer: Medicare Other | Admitting: Gastroenterology

## 2018-11-02 ENCOUNTER — Encounter: Payer: Self-pay | Admitting: Gastroenterology

## 2018-11-02 ENCOUNTER — Other Ambulatory Visit: Payer: Self-pay

## 2018-11-02 VITALS — Ht 70.0 in

## 2018-11-02 DIAGNOSIS — R634 Abnormal weight loss: Secondary | ICD-10-CM

## 2018-11-02 DIAGNOSIS — K5641 Fecal impaction: Secondary | ICD-10-CM

## 2018-11-02 MED ORDER — LUBIPROSTONE 8 MCG PO CAPS: 8.0000 ug | ORAL_CAPSULE | Freq: Two times a day (BID) | ORAL | 3 refills | Status: AC

## 2018-11-02 NOTE — Patient Instructions (Addendum)
Your provider has requested that you go to the basement level for lab work at your convenience, our lab is open from 8am-4pm, [place]. Press "B" on the elevator. The lab is located at the first door on the left as you exit the elevator.  Continue Miralax 17 grams daily if not responding to psyllium powder.   Warm tap water enemas   Avoid/discontinue any medications associated with constipation.   When fecal impaction has resolved, trial of Amitiza 8 mcg twice a day. This has been sent to your pharmacy.   You have been scheduled for a CT scan of the abdomen and pelvis at [facility] are scheduled on 11/16/2018 at 2:00 pm. You should arrive 15 minutes prior to your appointment time for registration. Please follow the written instructions below on the day of your exam:  WARNING: IF YOU ARE ALLERGIC TO IODINE/X-RAY DYE, PLEASE NOTIFY RADIOLOGY IMMEDIATELY AT [phone]! YOU WILL BE GIVEN A 13 HOUR PREMEDICATION PREP.  1) Do not eat or drink anything after 10:00 am (4 hours prior to your test) 2) You have been given 2 bottles of oral contrast to drink. The solution may taste better if refrigerated, but do NOT add ice or any other liquid to this solution. Shake well before drinking.    Drink 1 bottle of contrast @ 12:00 pm (2 hours prior to your exam)  Drink 1 bottle of contrast @ 1:00 pm   (1 hour prior to your exam)  You may take any medications as prescribed with a small amount of water, if necessary. If you take any of the following medications: METFORMIN, GLUCOPHAGE, GLUCOVANCE, AVANDAMET, RIOMET, FORTAMET, [place] MET, JANUMET, GLUMETZA or METAGLIP, you MAY be asked to HOLD this medication 48 hours AFTER the exam.  The purpose of you drinking the oral contrast is to aid in the visualization of your intestinal tract. The contrast solution may cause some diarrhea. Depending on your individual set of symptoms, you may also receive an intravenous injection of x-ray  contrast/dye. Plan on being at [facility] for 30 minutes or longer, depending on the type of exam you are having performed.  This test typically takes 30-45 minutes to complete.    ________________________________________________________________________

## 2018-11-02 NOTE — Progress Notes (Signed)
TELEHEALTH VISIT  Referring Provider: Asencion Noble, MD Primary Care Physician:  Asencion Noble, MD   Tele-visit due to COVID-19 pandemic Patient requested visit virtually, consented to the virtual encounter via audio enabled telemedicine application Contact made at:  Patient verified by name and date of birth Location of patient: Home Location provider: Office Names of persons participating: Me, patient, wife [name]) [place] Time spent on telehealth visit: 38 minutes I discussed the limitations of evaluation and management by telemedicine. The patient expressed understanding and agreed to proceed.  Reason for Consultation:  Fecal impaction, Weight loss   IMPRESSION:  Recurrent fecal impaction with overflow diarrhea Hypercalcemia noted on recent labs >50 pounds unintentional weight loss Distant normal colonoscopy  Will continue aggressive management at home. Cross sectional imaging recommended to exclude malignancy.  PLAN: Calcium and PTH TSH Warm tap water enemas  Encouraged in-home palliative care consultation (Will review with Dr. Sherry Ruffing) Avoid/discontinue medications associated with constipation Continue Miralax 17 grams daily if not responding to psyllium  CT of the abdomen and pelvis with and without contrast for further evaluation When fecal impaction has resolved, trial of Amitiza 8 mcg twice daily as it has the best safety data for a colonic secretagogue in older adults   HPI: [name] Brennan is a 78 y.o. male with hypertension, chronic kidney disease, hyperlipidemia, occlusive coronary artery disease, anxiety, arthritis, diabetes, CVA and vascular dementia. Recent halter monitor to evaluate paroxysmal nocturnal dyspnea. History of hemorrhoids requiring surgery. He has had ongoing problems with urinary retention requiring an indwelling catheter. Urologist has noted concurrent fecal impactions. Referred to GI for further evaluation.   Lifetime  history of normal bowel habits with a formed BM every day to every other day. He was on daily mineral oil following a hemorrhoid surgery 40 years ago.   Altered stool habits after his second stroke in August.  Worse over the last two months.  He is having problems with diarrhea. Urology suggested this was a result of his large bladder and that he was actually constipated. Urology staff performed a manual fecal impaction during his office visit there last week. They will not remove the indwelling catheter until his bowel habits have normalized. His wife is concerned that the impaction has returned.   [employer] worked at first. Add a Art gallery manager. Added Miralax in Apple Juice and water 17 grams daily 3 weeks ago.  Added Milk of Magnesia x 1 last week. Unable to place a Fleets enema due to the impacted stool. The patient's son is a CMA.   He has significant straining, a sensation of incomplete evacuation, frequent use of digital maneuvers, sensation of anorectal blockage noted with digital maneuvers, and a decrease in stool frequency. He hasn't had leakage or a good bowel movement since he was seen in the ED 10/11/18.  Difficulty eating since his last stroke. Everything tastes like grit or sand and prevents him from wanting to eat.  Using Boost, Water, and Ginger Ale. Poor appetite. He has lost at least 50 pounds. No other associated symptoms. No identified exacerbating or relieving features.   Screening colonoscopy >20 years ago. No recent abdominal imaging.   There has been no recent abdominal imaging. Labs from 10/11/18 show a Na 138,  CO2 21, glucose 168, BUN 36, crt 1.05, calcium 11.3, TB 1.8, AST 36, ALT 21, alk phos 220.  Last TSH was 08/03/18: 0.976.  Calcium was 10.3 08/03/18 and 10.1 02/07/18.  Brother with prostate cancer. No  known family history of colon cancer or polyps. No family history of uterine/endometrial cancer, pancreatic cancer or gastric/stomach cancer.  Past Medical History:   Diagnosis Date  . Allergy   . CRI (chronic renal insufficiency)   . Dyslipidemia    Combined with hx of marked hypertriglyceridemia  . Hearing loss   . Hemorrhoids 1979  . Hypertension   . Inguinal hernia September 2012   Bilateral   . Myocardial infarction, old    Remote inferior MI with known occlusion of the RCA.  Medical management  . Occlusive coronary artery disease    History of remote inferior MI.  Documented CTO of RCA with left-to-right collaterals -> treated medically.  NO STENT.  . Osteoarthritis   . Psoriasis   . Skin cancer     Past Surgical History:  Procedure Laterality Date  . CARDIAC CATHETERIZATION  01/13/2004   Single-vessel CAD- mRCA CTO with left-to-right collaterals & faint R-R collaterals.  EF 50-55%  . cataract     with lens implants  . HEMORROIDECTOMY    . INGUINAL HERNIA REPAIR  03/11/2011   bilateral    Current Outpatient Medications  Medication Sig Dispense Refill  . aspirin 81 MG tablet Take 81 mg by mouth daily.      . folic acid (FOLVITE) 1 MG tablet Take 1 tablet (1 mg total) by mouth daily. 90 tablet 3  . GuaiFENesin (MUCUS RELIEF ADULT PO) Take by mouth as needed.     . Melatonin ([employer]S MELATONIN) 5 MG TABS TAKE 1 TABLET (5 MG TOTAL) BY MOUTH AT BEDTIME AS NEEDED. 120 tablet 0  . Methylfol-Methylcob-Acetylcyst (CEREFOLIN NAC) 6-2-600 MG TABS Take 1 tablet by mouth daily. 30 each 3  . metoprolol succinate (TOPROL-XL) 25 MG 24 hr tablet Take 0.5 tablets (12.5 mg total) by mouth daily. Take with or immediately following a meal. 30 tablet 1  . metoprolol succinate (TOPROL-XL) 25 MG 24 hr tablet TAKE 1 TABLET BY MOUTH EVERY DAY 90 tablet 1  . sennosides-docusate sodium (SENOKOT-S) 8.6-50 MG tablet Take 1 tablet by mouth 2 (two) times daily.    . sertraline (ZOLOFT) 25 MG tablet TAKE 1 TABLET BY MOUTH EVERY DAY 90 tablet 1  . tamsulosin (FLOMAX) 0.4 MG CAPS capsule Take 1 capsule (0.4 mg total) by mouth daily after supper. 7 capsule 0  .  triamcinolone (KENALOG) 0.1 % cream Apply 1 application topically daily as needed (rash).      No current facility-administered medications for this visit.     Allergies as of 11/02/2018 - Review Complete 10/11/2018  Allergen Reaction Noted  . Amoxicillin Swelling 02/06/2018  . Atorvastatin Swelling 04/06/2016  . Avalide [irbesartan-hydrochlorothiazide]  03/13/2011  . Cymbalta [duloxetine hcl]  03/13/2011  . Escitalopram oxalate  03/13/2011  . Fenofibrate  03/13/2011    Family History  Problem Relation Age of Onset  . Unexplained death Mother   . Heart attack Father 65  . Prostate cancer Brother   . Skin cancer Brother     Social History   Socioeconomic History  . Marital status: Married    Spouse name: Not on file  . Number of children: 1  . Years of education: Not on file  . Highest education level: Not on file  Occupational History  . Occupation: WELDER    Fish farm manager: [employer]  Social Needs  . Financial resource strain: Not on file  . Food insecurity:    Worry: Not on file    Inability: Not on file  .  Transportation needs:    Medical: Not on file    Non-medical: Not on file  Tobacco Use  . Smoking status: Never Smoker  . Smokeless tobacco: Never Used  Substance and Sexual Activity  . Alcohol use: No  . Drug use: No  . Sexual activity: Not on file  Lifestyle  . Physical activity:    Days per week: Not on file    Minutes per session: Not on file  . Stress: Not on file  Relationships  . Social connections:    Talks on phone: Not on file    Gets together: Not on file    Attends religious service: Not on file    Active member of club or organization: Not on file    Attends meetings of clubs or organizations: Not on file    Relationship status: Not on file  . Intimate partner violence:    Fear of current or ex partner: Not on file    Emotionally abused: Not on file    Physically abused: Not on file    Forced sexual activity: Not on file  Other  Topics Concern  . Not on file  Social History Narrative  . Not on file    Review of Systems: ALL ROS discussed and all others negative except listed in HPI.  Physical Exam: General: in no acute distress Neuro: Alert and appropriate Psych: Normal affect and normal insight   Carrol Bondar L. Tarri Glenn, MD, MPH [facility] 11/02/2018, 3:02 PM

## 2018-11-06 ENCOUNTER — Telehealth: Payer: Self-pay | Admitting: Gastroenterology

## 2018-11-06 ENCOUNTER — Telehealth: Payer: Self-pay

## 2018-11-06 NOTE — Telephone Encounter (Signed)
[name] Brennan with [facility] requesting to speak with a nurse about putting one of the attending name to  signing an orders. Please call back.

## 2018-11-06 NOTE — Telephone Encounter (Signed)
[facility] - informed Dr Angelia Mould will be PCP to receive / sign any orders.

## 2018-11-06 NOTE — Telephone Encounter (Signed)
Return call to [facility]   - wants to know which Attending is willing to sign and be primary care for hospice services (they will be seeing pt this week). Also stated pt's wife is willing to let their doctor  be primary if ok w/pt's PCP.

## 2018-11-06 NOTE — Telephone Encounter (Signed)
I can be the listed attending PCP

## 2018-11-06 NOTE — Telephone Encounter (Signed)
Patient wife called said that the patient is at hospice and they will be doing the labs that need to be done but would like to know what labs need to be done.   Also that the blockage is finally out and that the med did help.

## 2018-11-06 NOTE — Telephone Encounter (Signed)
Pt wanted to know if CT is still necessary since pt has passed the blockage.

## 2018-11-06 NOTE — Telephone Encounter (Signed)
CT scan is not needed at this time. [name] Brennan and his family will be in my thoughts. I am available if any assistance is needed in the future. Thank you.

## 2018-11-06 NOTE — Telephone Encounter (Signed)
Spoke with the patient's wife, the wife reports the patient is in hospice and has been diagnosed with Advanced Vascular Dementia and has quickly started to decline. The patient is only mobile to the bathroom and back to the bedroom. The wife reports the patient has defecated on and off for 2 days and passed two "fist size" stools, with diarrhea and soft to "pudding" like stools intermittently. No bowel movements reported today. The wife wants to know if Dr. Tarri Glenn still wants the patient to complete the abd CT scheduled for 5/25. Please advise.   The wife also wanted to know the labs GI wanted to allow hospice to drawn the labs considering the patient's mobility. This RN provided the wife with the labs and requested the hospice RN to call and fax lab findings to [facility].

## 2018-11-06 NOTE — Telephone Encounter (Signed)
Notified patient's wife and told her to call back in one week with an update.

## 2018-11-16 ENCOUNTER — Ambulatory Visit (HOSPITAL_COMMUNITY): Payer: Medicare Other

## 2018-11-16 ENCOUNTER — Ambulatory Visit (HOSPITAL_COMMUNITY): Payer: Medicare Other | Attending: Gastroenterology

## 2018-11-16 ENCOUNTER — Ambulatory Visit (HOSPITAL_COMMUNITY): Admission: RE | Admit: 2018-11-16 | Payer: Medicare Other | Source: Ambulatory Visit

## 2018-11-20 DEATH — deceased

## 2018-12-04 ENCOUNTER — Encounter: Payer: Medicare Other | Admitting: Internal Medicine

## 2019-02-20 ENCOUNTER — Telehealth: Payer: Self-pay | Admitting: *Deleted

## 2019-02-20 NOTE — Telephone Encounter (Signed)
LEFT MESSAGE ON VOICEMAIL  - SWITCHING APPOINTMENT TO VIRTUAL 02/23/19  AT 1:20 PM  IF POSSIBLE HAVE VITAL SIGNS AVAILABLE -  NAY QUESTION MAY CALL BACK     TELEPHONE CALL NOTE  This patient has been deemed a candidate for follow-up tele-health visit to limit community exposure during the Covid-19 pandemic. I LEFT MESSAGE via phone giving instructions. T their E-Visit at which time consent will be verbally confirmed.   A Virtual Office Visit appointment type has been scheduled for9/4 with [facility] ,  "TELEPHONE"  I have  confirmed the patient is active in [place], [place], [place] 02/20/2019 11:58 AM

## 2019-02-23 ENCOUNTER — Telehealth: Payer: Self-pay | Admitting: Cardiology

## 2019-02-27 NOTE — Telephone Encounter (Signed)
APPOINTMENT CANCELLED - DUE TO PATIENT DECEASED PER  SCHEDULER

## 2020-02-06 IMAGING — CT CT HEAD W/O CM
3 of 6 series · 16 of 47 positions shown, 19 images · non-contrast
Comparison: None.

CLINICAL DATA: Altered mental status.  Headache and facial pain.

EXAM:
CT HEAD WITHOUT CONTRAST
CT MAXILLOFACIAL WITHOUT CONTRAST
TECHNIQUE: Multidetector CT imaging of the head and maxillofacial structures
were performed using the standard protocol without intravenous
contrast. Multiplanar CT image reconstructions of the maxillofacial
structures were also generated.

[Series 8: facial/ orbits 2.0 h30s · axial · 0.39mm/px · z∈[+1486,+1646]mm · 10 of 97 slices shown, 13 images]
[im 9/97  brain]
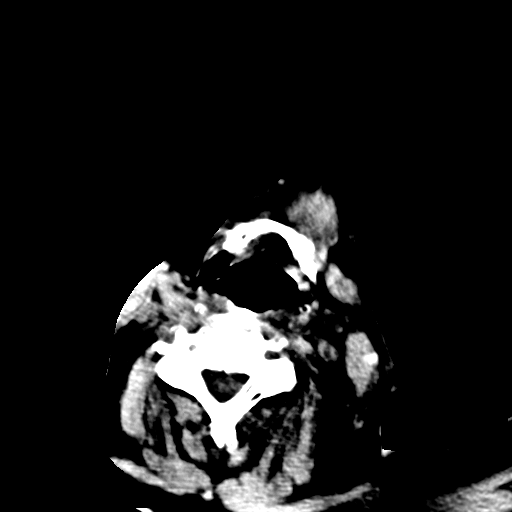
[im 9/97  bone]
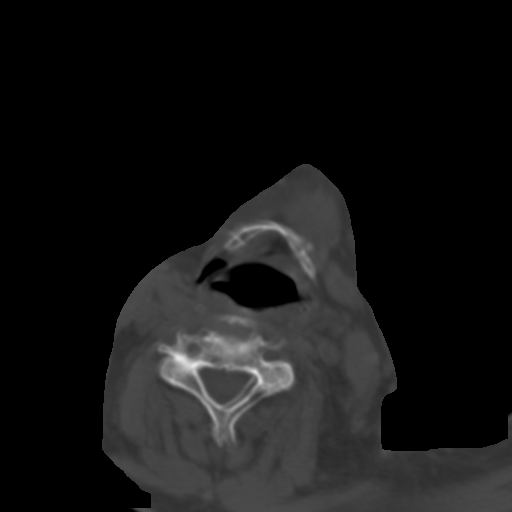
[im 17/97  brain]
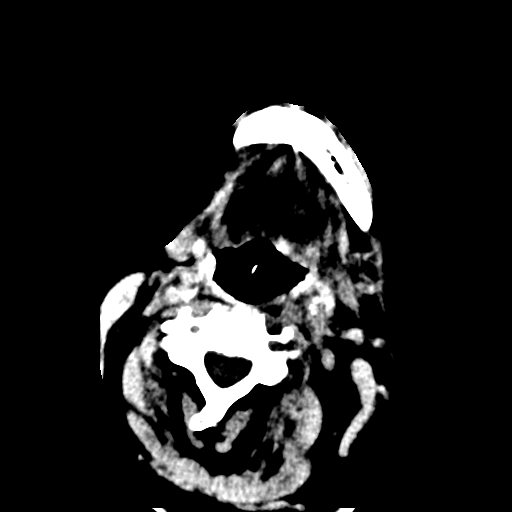
[im 25/97  brain]
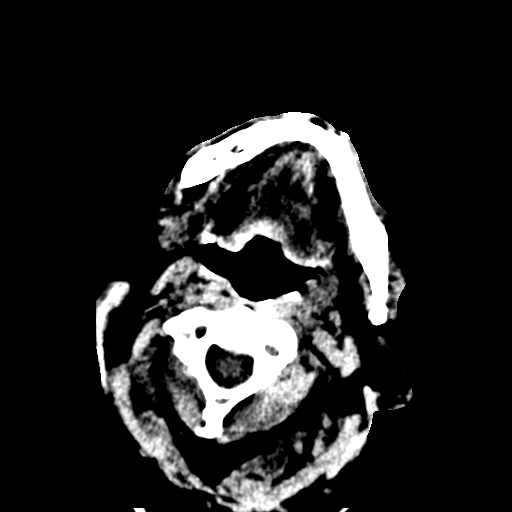
[im 33/97  brain]
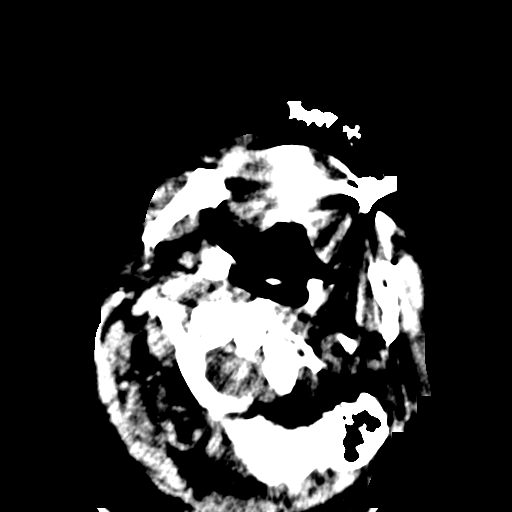
[im 41/97  brain]
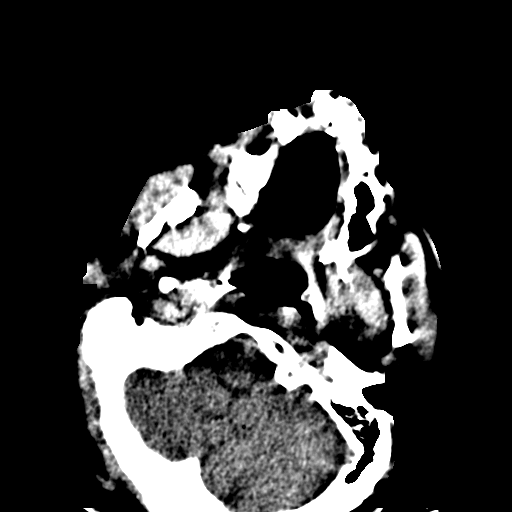
[im 41/97  bone]
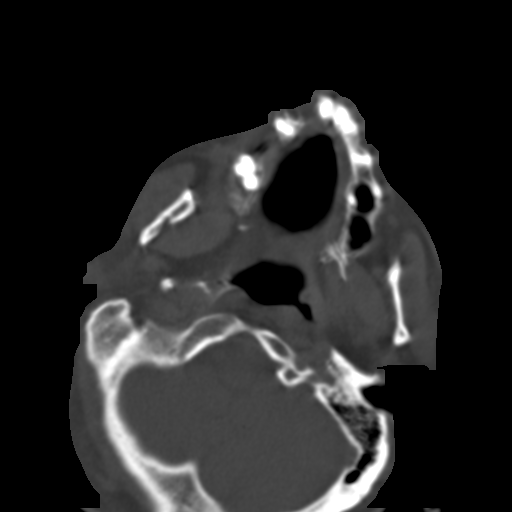
[im 57/97  brain]
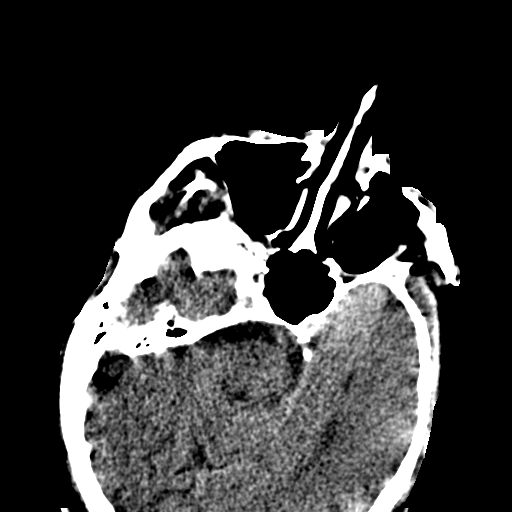
[im 65/97  brain]
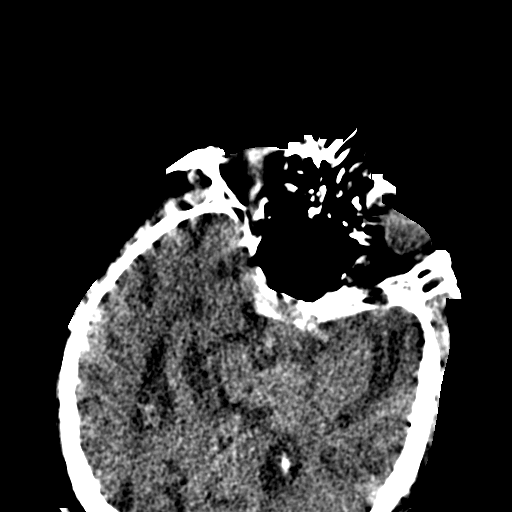
[im 73/97  brain]
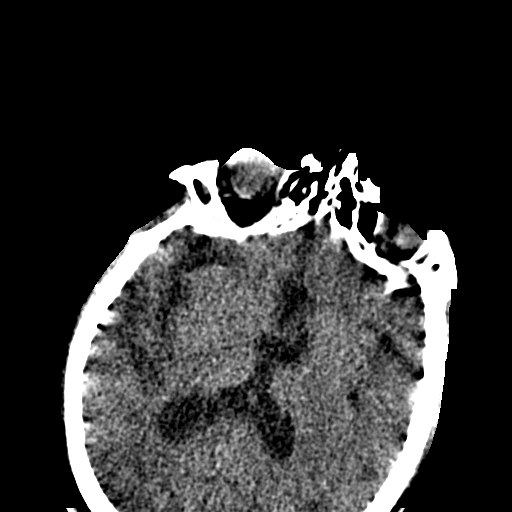
[im 81/97  brain]
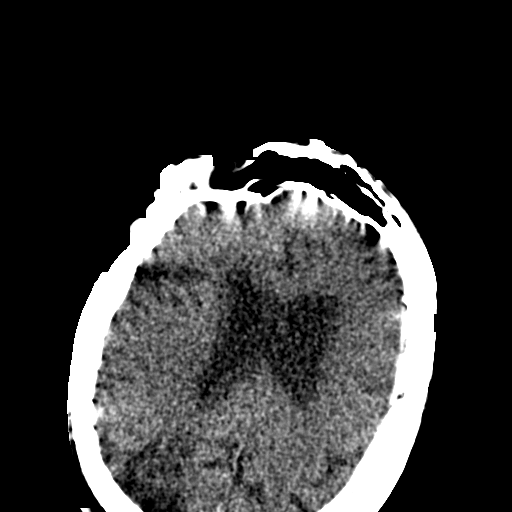
[im 81/97  bone]
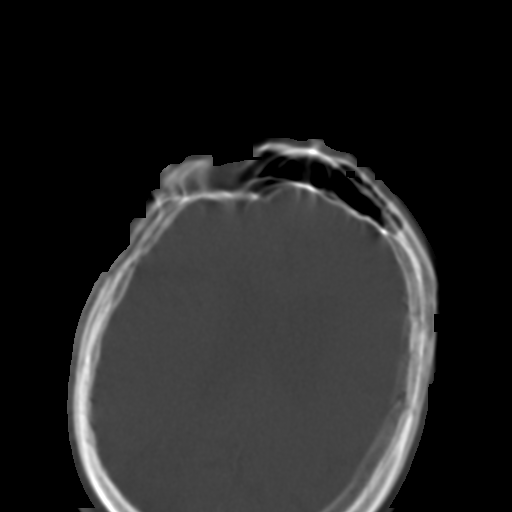
[im 89/97  brain]
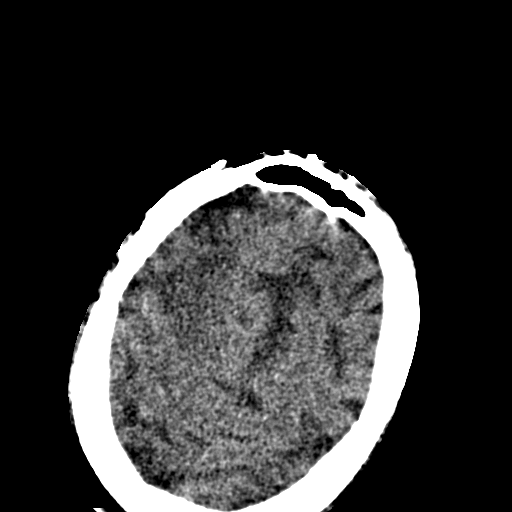

[Series 15: coronal soft tissue · coronal · 0.39mm/px · 3 of 80 slices shown]
[im 27/80  brain]
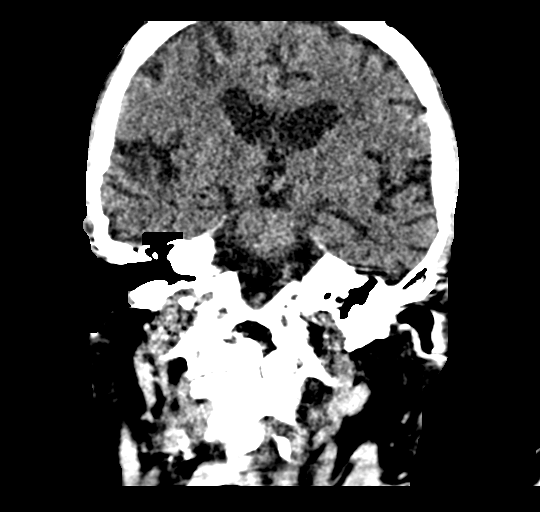
[im 36/80  brain]
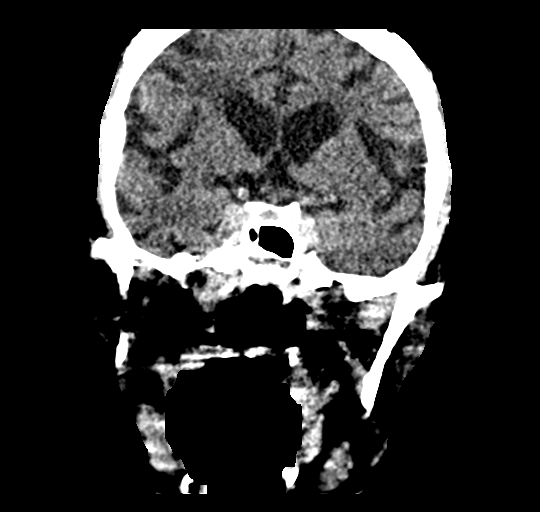
[im 44/80  brain]
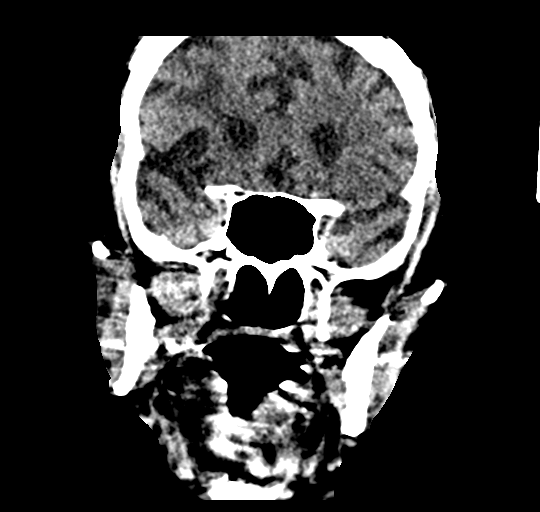

[Series 16: sagittal soft tissue · sagittal · 0.41mm/px · 3 of 77 slices shown]
[im 19/77  brain]
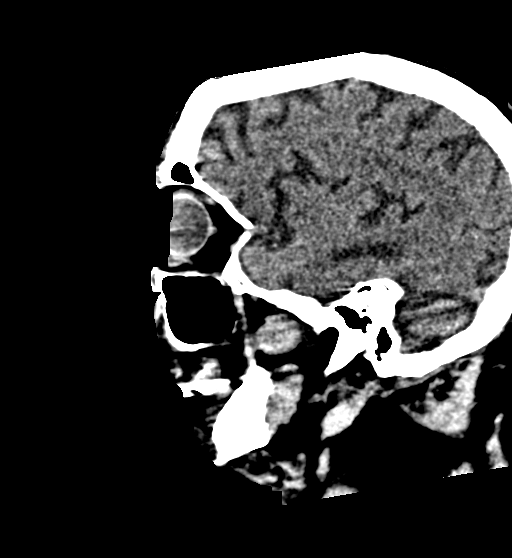
[im 38/77  brain]
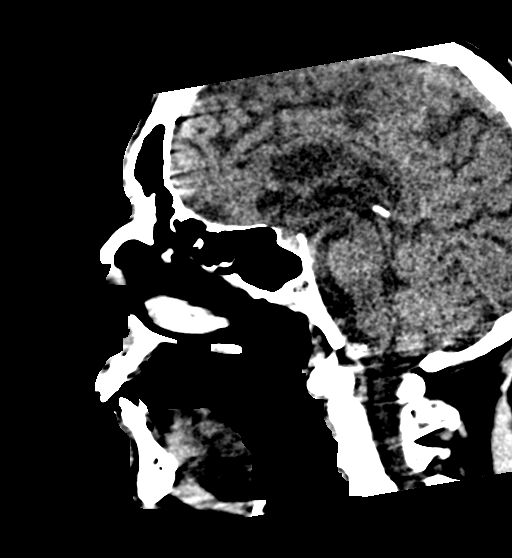
[im 57/77  brain]
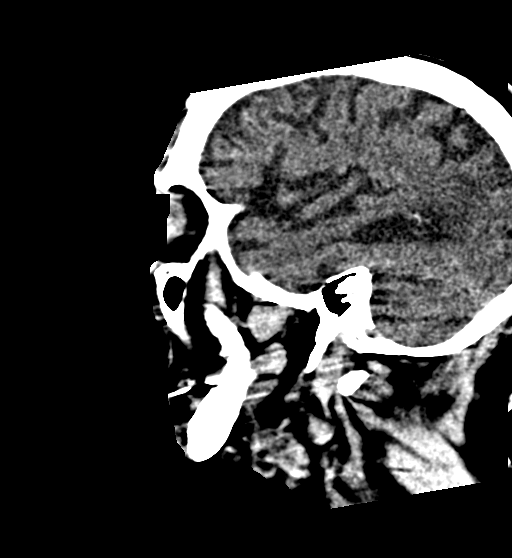

[16 of 47 positions shown; findings below may reference images not displayed]

FINDINGS: CT HEAD FINDINGS

Brain: Generalized atrophy. Background pattern of chronic small
vessel ischemic changes throughout the white matter. Old infarction
in the right middle cerebral artery territory with cortical and
subcortical encephalomalacia. No identifiable acute infarction, mass
lesion, hemorrhage, hydrocephalus or extra-axial collection.

Vascular: There is atherosclerotic calcification of the major
vessels at the base of the brain.

Skull: Normal

Other: None

CT MAXILLOFACIAL FINDINGS

Osseous: No facial fracture or acute bone lesion. The patient does
have some dental decay.

Orbits: Normal

Sinuses: Clear.  No acute or chronic inflammatory changes.

Soft tissues: No soft tissue lesion of the face identified.
IMPRESSION: Head CT: No acute finding. Atrophy and chronic small-vessel ischemic
change. Old right middle cerebral artery territory infarction.

Maxillofacial CT: No acute or traumatic finding. No evidence of
sinusitis. No cause of facial pain identified. The patient does have
some dental decay.
